# Patient Record
Sex: Female | Born: 1995 | Race: Black or African American | Hispanic: No | Marital: Single | Smoking: Never smoker
Health system: Southern US, Community
[De-identification: ages and names within clinical notes are randomized; demographics above are authoritative.]

## PROBLEM LIST (undated history)

## (undated) DIAGNOSIS — I10 Essential (primary) hypertension: Secondary | ICD-10-CM

## (undated) DIAGNOSIS — T7840XA Allergy, unspecified, initial encounter: Secondary | ICD-10-CM

## (undated) DIAGNOSIS — Z3009 Encounter for other general counseling and advice on contraception: Secondary | ICD-10-CM

## (undated) DIAGNOSIS — L309 Dermatitis, unspecified: Secondary | ICD-10-CM

## (undated) DIAGNOSIS — Z30017 Encounter for initial prescription of implantable subdermal contraceptive: Secondary | ICD-10-CM

## (undated) DIAGNOSIS — E785 Hyperlipidemia, unspecified: Secondary | ICD-10-CM

## (undated) HISTORY — DX: Hyperlipidemia, unspecified: E78.5

## (undated) HISTORY — DX: Encounter for initial prescription of implantable subdermal contraceptive: Z30.017

## (undated) HISTORY — PX: WISDOM TOOTH EXTRACTION: SHX21

## (undated) HISTORY — DX: Allergy, unspecified, initial encounter: T78.40XA

## (undated) HISTORY — DX: Encounter for other general counseling and advice on contraception: Z30.09

## (undated) HISTORY — DX: Dermatitis, unspecified: L30.9

---
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
## 2017-02-14 ENCOUNTER — Other Ambulatory Visit: Payer: Self-pay

## 2017-02-14 ENCOUNTER — Ambulatory Visit (INDEPENDENT_AMBULATORY_CARE_PROVIDER_SITE_OTHER): Payer: Medicaid Other | Admitting: Family Medicine

## 2017-02-14 ENCOUNTER — Encounter: Payer: Self-pay | Admitting: Family Medicine

## 2017-02-14 VITALS — BP 124/80 | HR 69 | Temp 98.4°F | Resp 16 | Ht 66.0 in | Wt 284.5 lb

## 2017-02-14 DIAGNOSIS — R5383 Other fatigue: Secondary | ICD-10-CM

## 2017-02-14 DIAGNOSIS — Z6841 Body Mass Index (BMI) 40.0 and over, adult: Secondary | ICD-10-CM

## 2017-02-14 DIAGNOSIS — D649 Anemia, unspecified: Secondary | ICD-10-CM

## 2017-02-14 DIAGNOSIS — E66813 Obesity, class 3: Secondary | ICD-10-CM

## 2017-02-14 DIAGNOSIS — Z13220 Encounter for screening for lipoid disorders: Secondary | ICD-10-CM | POA: Diagnosis not present

## 2017-02-14 DIAGNOSIS — M25572 Pain in left ankle and joints of left foot: Secondary | ICD-10-CM | POA: Diagnosis not present

## 2017-02-14 NOTE — Progress Notes (Signed)
Patient ID: Brandi Madden, female    DOB: 1995-12-06, 22 y.o.   MRN: [id number]  Chief Complaint  Patient presents with  . Foot Swelling    left    Allergies Other  Subjective:   Brandi Madden is a 22 y.o. female who [facility].  HPI Ms. Brandi Madden is a 22 year old female who is a [employer]man at [employer] studying to be a Engineer, civil (consulting)nurse.  She also works at [employer] part-time.  She comes in today to establish care and to seek evaluation of her left foot and ankle.  She reports that for the past 1-2 months she has had pain in her left ankle region.  She reports she did not have any inciting trauma or injury.  She has had no falls.  She reports that for the past week her left ankle has been swollen and bothering her on a constant basis.  She reports that over the past month the pain has gradually worsened and she is finding it difficult to walk to all of her classes across campus because of the pain.  She reports that the pain is sharp in nature or it can be dull and throbbing.  She reports her current pain is a 7-8 out of 10.  She is not taking any medication for the pain.  She does tell me that last month she went to the emergency department at [facility] for evaluation.  Upon review of the chart, I informed the patient that it was actually approximately 4-5 months ago that she was seen in the emergency department for the ankle pain.  She did have x-rays performed at that time which were negative.  She then remembers that she did injure her ankle quite a while ago and the pain got better and her symptoms resolved.  Th then apparently 1-2 months ago, with no inciting injury or event, her ankle became painful and is subsequently been warm and swollen.  She reports that she is a healthy lady.  She denies any tobacco alcohol or drug use.  She has never been sexually active.  She does have a hormonal implant in her left arm, but she reports that her mother made her  do this for protection in case she did become sexually active.  She reports that she is previously been seen in the pediatric office in town.  She reports that her energy level is pretty good but she can get low energy from time to time.  She reports her mood is good.  She denies any depression, anxiety or struggles with her mood.  She reports her grades are good and she is very focused on her schoolwork.  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  She does not do any form of exercise.  She reports that she has been overweight for a long time.  She has never been screened for diabetes or high cholesterol.  She does have a family history of cardiovascular disease.  [facility] is followed at [facility].  She reports that her periods last approximately 1 week.  She has been anemic in the past but never actually took the iron pills.  She reports that her mother never bought them for her so she never started taking them.   Foot Injury   The incident occurred more than 1 week ago (no injury but pain for a month). There was no injury mechanism. The pain is present in the left ankle. The  pain is at a severity of 7/10. The pain is moderate. The pain has been fluctuating since onset. Pertinent negatives include no loss of motion, loss of sensation, muscle weakness, numbness or tingling.    Past Medical History:  Diagnosis Date  . Allergy   . Eczema   . Encounter for education about contraceptive use 06/29/2015  . Hyperlipemia   . Nexplanon insertion 07/21/2015   Inserted left arm 07/21/15    Past Surgical History:  Procedure Laterality Date  . WISDOM TOOTH EXTRACTION      Family History  Problem Relation Age of Onset  . Hypertension Mother   . Hyperlipidemia Mother   . Stroke Maternal Grandmother   . Hypertension Maternal Grandfather   . Diabetes Paternal Grandmother      Social History   Socioeconomic History  . Marital status: Single    Spouse name: None  . Number of children: None  . Years  of education: None  . Highest education level: None  Social Needs  . Financial resource strain: None  . Food insecurity - worry: None  . Food insecurity - inability: None  . Transportation needs - medical: None  . Transportation needs - non-medical: None  Occupational History  . None  Tobacco Use  . Smoking status: Never Smoker  . Smokeless tobacco: Never Used  Substance and Sexual Activity  . Alcohol use: No  . Drug use: No  . Sexual activity: Not Currently    Birth control/protection: Implant  Other Topics Concern  . None  Social History Narrative   Live in [place], [place].   Has a younger sister, 7 y/o.   Married.    Eats all food groups.   Has a car at school.   No tickets. Wear seatbelt.       [facility].     Review of Systems  Constitutional: Negative for activity change, appetite change and fever.  HENT: Negative for nosebleeds, rhinorrhea, sinus pressure, sinus pain and trouble swallowing.   Eyes: Negative for visual disturbance.  Respiratory: Negative for cough, chest tightness and shortness of breath.   Cardiovascular: Negative for chest pain, palpitations and leg swelling.  Gastrointestinal: Negative for abdominal pain, nausea and vomiting.  Genitourinary: Negative for dysuria, frequency and urgency.  Musculoskeletal: Positive for arthralgias and joint swelling.       Just pain and swelling in the left ankle.   Skin: Negative for rash.  Neurological: Negative for dizziness, tingling, syncope, light-headedness and numbness.  Hematological: Negative for adenopathy. Does not bruise/bleed easily.  Psychiatric/Behavioral: Negative for behavioral problems, dysphoric mood, sleep disturbance and suicidal ideas. The patient is not nervous/anxious.      Objective:   BP 124/80 (BP Location: Left Arm, Patient Position: Sitting, Cuff Size: Normal)   Pulse 69   Temp 98.4 F (36.9 C) (Temporal)   Resp 16   Ht 5\' 6"  (1.676 m)   Wt 284 lb 8 oz (129 kg)   LMP  02/12/2017   SpO2 96%   BMI 45.92 kg/m   Physical Exam  Constitutional: She is oriented to person, place, and time. She appears well-developed and well-nourished. No distress.  HENT:  Head: Normocephalic and atraumatic.  Eyes: Pupils are equal, round, and reactive to light.  Neck: Normal range of motion. Neck supple. No thyromegaly present.  Cardiovascular: Normal rate, regular rhythm and normal heart sounds.  Pulmonary/Chest: Effort normal and breath sounds normal. No respiratory distress.  Musculoskeletal:       Left ankle: She  exhibits decreased range of motion and swelling. She exhibits no ecchymosis, no deformity and no laceration. Tenderness. Lateral malleolus tenderness found. No medial malleolus tenderness found. Achilles tendon exhibits no pain, no defect and normal Thompson's test results.  Patient with decreased range of motion with plantar and dorsiflexion.  Pain with inversion and eversion of foot at the ankle.  1+ edema in ankle, mainly dependent.  Overall foot is slightly warmer than the right foot.  Neurological: She is alert and oriented to person, place, and time. No cranial nerve deficit.  Skin: Skin is warm and dry.  Psychiatric: She has a normal mood and affect. Her behavior is normal. Judgment and thought content normal.  Nursing note and vitals reviewed.    Assessment and Plan  1. Acute left ankle pain Patient currently home from college and has limited time span to get evaluation.  Will likely need orthopedic evaluation due to decreased range of motion, tenderness, and swelling in her ankle.  She is having difficulty with walking to class and daily activities secondary to pain and swelling.  We called and got an appointment with her to be seen in orthopedic in the [place] office today.  She is headed there upon leaving our office. - Ambulatory referral to Orthopedic Surgery  2. Anemia, unspecified type History of anemia with no iron supplementation initiated.  Check  labs today - CBC with Differential/Platelet - Fe+TIBC+Fer  3. Fatigue, unspecified type Episodic, sporadic, functioning well.  Rule out underlying metabolic etiology - TSH - Basic metabolic panel  4. Screening cholesterol level - Lipid panel  5. Class 3 severe obesity due to excess calories without serious comorbidity with body mass index (BMI) of 45.0 to 49.9 in adult [facility]) Long discussion with patient today regarding her weight and the risks that are associated with her body mass index.  We discussed that her weight puts her at increased risk for diabetes, cardiac issues, hypertension and other obesity related health disorders.  She is interested in losing weight and getting in better physical condition.  We will check her baseline labs today.  She will follow-up and will consider nutritional therapy for weight loss assistance. - Hemoglobin A1c - Lipid panel  Records quested from previous PCP/pediatrician Return in about 2 months (around 04/14/2017) for Follow-up CPE. Aliene Beams, MD 02/14/2017

## 2017-02-15 LAB — LIPID PANEL
CHOLESTEROL: 228 mg/dL — AB (ref <170)
HDL: 49 mg/dL (ref >45)
LDL Cholesterol (Calc): 166 mg/dL (calc) — ABNORMAL HIGH (ref <110)
Non-HDL Cholesterol (Calc): 179 mg/dL (calc) — ABNORMAL HIGH (ref <120)
TRIGLYCERIDES: 43 mg/dL (ref <90)
Total CHOL/HDL Ratio: 4.7 (calc) (ref <5.0)

## 2017-02-15 LAB — CBC WITH DIFFERENTIAL/PLATELET
BASOS PCT: 0.5 %
Basophils Absolute: 29 cells/uL (ref 0–200)
EOS PCT: 2.4 %
Eosinophils Absolute: 139 cells/uL (ref 15–500)
HCT: 30.8 % — ABNORMAL LOW (ref 35.0–45.0)
HEMOGLOBIN: 9.8 g/dL — AB (ref 11.7–15.5)
LYMPHS ABS: 2152 {cells}/uL (ref 850–3900)
MCH: 24 pg — ABNORMAL LOW (ref 27.0–33.0)
MCHC: 31.8 g/dL — ABNORMAL LOW (ref 32.0–36.0)
MCV: 75.3 fL — AB (ref 80.0–100.0)
MPV: 9.7 fL (ref 7.5–12.5)
Monocytes Relative: 9 %
NEUTROS ABS: 2958 {cells}/uL (ref 1500–7800)
Neutrophils Relative %: 51 %
Platelets: 412 10*3/uL — ABNORMAL HIGH (ref 140–400)
RBC: 4.09 10*6/uL (ref 3.80–5.10)
RDW: 15.5 % — AB (ref 11.0–15.0)
Total Lymphocyte: 37.1 %
WBC: 5.8 10*3/uL (ref 3.8–10.8)
WBCMIX: 522 {cells}/uL (ref 200–950)

## 2017-02-15 LAB — IRON,TIBC AND FERRITIN PANEL
%SAT: 19 % (calc) (ref 8–45)
FERRITIN: 45 ng/mL (ref 6–67)
IRON: 61 ug/dL (ref 27–164)
TIBC: 321 mcg/dL (calc) (ref 271–448)

## 2017-02-15 LAB — BASIC METABOLIC PANEL
BUN: 8 mg/dL (ref 7–20)
CHLORIDE: 106 mmol/L (ref 98–110)
CO2: 23 mmol/L (ref 20–32)
CREATININE: 0.56 mg/dL (ref 0.50–1.00)
Calcium: 9.1 mg/dL (ref 8.9–10.4)
Glucose, Bld: 87 mg/dL (ref 65–99)
POTASSIUM: 5.1 mmol/L (ref 3.8–5.1)
Sodium: 138 mmol/L (ref 135–146)

## 2017-02-15 LAB — HEMOGLOBIN A1C
HEMOGLOBIN A1C: 5.3 %{Hb} (ref <5.7)
Mean Plasma Glucose: 105 (calc)
eAG (mmol/L): 5.8 (calc)

## 2017-02-15 LAB — TSH: TSH: 2.77 mIU/L

## 2017-02-18 ENCOUNTER — Encounter: Payer: Self-pay | Admitting: Family Medicine

## 2017-02-18 DIAGNOSIS — D649 Anemia, unspecified: Secondary | ICD-10-CM

## 2017-02-19 NOTE — Telephone Encounter (Signed)
Patient informed of message below, verbalized understanding.  

## 2017-02-19 NOTE — Telephone Encounter (Signed)
Please call patient and advise her that her lab results do reveal that she is very anemic.  Her hemoglobin is approximately 9.   Please come to the lab to get the blood work completed and after I review it I will call  iron pills if needed.  Advise her that her hemoglobin A1c was within normal limits and she is not prediabetic nor diabetic at this time.

## 2017-02-24 ENCOUNTER — Encounter: Payer: Self-pay | Admitting: Family Medicine

## 2017-02-24 LAB — HEMOGLOBINOPATHY EVALUATION
Fetal Hemoglobin Testing: <1 % (ref 0.0–1.9)
HCT: 32.4 % — ABNORMAL LOW (ref 35.0–45.0)
HEMOGLOBIN A2 - HGBRFX: 2.2 % (ref 1.8–3.5)
HEMOGLOBIN: 9.7 g/dL — AB (ref 11.7–15.5)
Hgb A: 96.8 % (ref >96.0)
MCH: 24.2 pg — AB (ref 27.0–33.0)
MCV: 80.8 fL (ref 80.0–100.0)
RBC: 4.01 10*6/uL (ref 3.80–5.10)
RDW: 15.4 % — ABNORMAL HIGH (ref 11.0–15.0)

## 2017-02-24 LAB — RETICULOCYTES
ABS RETIC: 59850 {cells}/uL (ref 20000–8000)
Retic Ct Pct: 1.5 %

## 2017-02-26 ENCOUNTER — Telehealth: Payer: Self-pay | Admitting: Family Medicine

## 2017-02-26 DIAGNOSIS — D649 Anemia, unspecified: Secondary | ICD-10-CM

## 2017-02-26 NOTE — Telephone Encounter (Signed)
Please call patient and advised that her hemoglobin is approximately 9.7.  This is low and she is anemic.  However, her iron stores in her body are within normal limits and I do not believe that this is necessarily the cause of her anemia at this time.  Her hemoglobin type is normal and it does not show that she has an abnormal hemoglobin meaning she does not have thalassemia or sickle cell.  At this time, I would like for her to follow-up with hematology for evaluation of her anemia.  I have placed a referral.  Please advise that she should receive a call regarding scheduling an appointment.

## 2017-02-27 ENCOUNTER — Telehealth: Payer: Self-pay

## 2017-02-27 NOTE — Telephone Encounter (Signed)
Please call pt back about her blood work.  She has additional questions and wants to talk with you

## 2017-02-27 NOTE — Telephone Encounter (Signed)
Patient called back and wanted me to explain to her mother the labs. I did so.

## 2017-02-27 NOTE — Telephone Encounter (Signed)
Patient informed of message below, verbalized understanding.  

## 2017-03-26 ENCOUNTER — Encounter: Payer: Medicaid Other | Admitting: Family Medicine

## 2017-03-28 ENCOUNTER — Ambulatory Visit (INDEPENDENT_AMBULATORY_CARE_PROVIDER_SITE_OTHER): Payer: Medicaid Other | Admitting: Family Medicine

## 2017-03-28 ENCOUNTER — Ambulatory Visit (HOSPITAL_COMMUNITY): Payer: Medicaid Other | Admitting: Hematology

## 2017-03-28 ENCOUNTER — Encounter: Payer: Self-pay | Admitting: Family Medicine

## 2017-03-28 ENCOUNTER — Other Ambulatory Visit: Payer: Self-pay

## 2017-03-28 VITALS — BP 126/84 | HR 78 | Temp 98.2°F | Resp 16 | Ht 66.0 in | Wt 283.2 lb

## 2017-03-28 DIAGNOSIS — H66003 Acute suppurative otitis media without spontaneous rupture of ear drum, bilateral: Secondary | ICD-10-CM

## 2017-03-28 DIAGNOSIS — Z6841 Body Mass Index (BMI) 40.0 and over, adult: Secondary | ICD-10-CM

## 2017-03-28 DIAGNOSIS — D649 Anemia, unspecified: Secondary | ICD-10-CM | POA: Diagnosis not present

## 2017-03-28 DIAGNOSIS — H6122 Impacted cerumen, left ear: Secondary | ICD-10-CM

## 2017-03-28 LAB — POCT INFLUENZA A/B
INFLUENZA B, POC: NEGATIVE
Influenza A, POC: NEGATIVE

## 2017-03-28 LAB — POCT RAPID STREP A (OFFICE): Rapid Strep A Screen: NEGATIVE

## 2017-03-28 MED ORDER — AZITHROMYCIN 250 MG PO TABS: ORAL_TABLET | ORAL | 0 refills | Status: DC

## 2017-03-28 NOTE — Progress Notes (Signed)
Patient ID: Brandi Madden, female    DOB: 24-Jan-1995, 22 y.o.   MRN: [id number]  Chief Complaint  Patient presents with  . Generalized Body Aches  . Sore Throat  . Emesis  . Ear Pain    Allergies Other  Subjective:   [name] is a 22 y.o. female who presents to [facility] today.  HPI Brandi Madden presents today with her mother for a visit. She was initially scheduled for a cPE but is sick. Reports that for over a week has had a sore throat,  cough, burning in chest with cough. Initially she felt she was getting better after about 3 days but then got worse. Reports that has gotten worse. [facility] ED b/c of symtpoms. has a CXR done which was normal. Has had persistent left ear pain over the past several days which has progressively been getting worse. No dysuria. Drinking less. Coughing on and off all night. Has had a decreased appetite and did not eat today. Drank some orange juice. Reports that she has had  Subjective fever, chills. Has had no diarrhea. No BM in a couple day but not eating much. No abdominal pain. Not been sleeping well b/c sick. Using some OTC cough syrup.   Her mother is her for the visit and would like to discuss her labs. She had not been on iron pills. She had been anemic for years. Reports fatigue with anemia.  Hyperlipidemia  This is a new problem. This is a new diagnosis. The problem is uncontrolled. Recent lipid tests were reviewed and are high. Exacerbating diseases include obesity. She has no history of chronic renal disease, diabetes, hypothyroidism, liver disease or nephrotic syndrome. Factors aggravating her hyperlipidemia include fatty foods. Pertinent negatives include no chest pain, focal sensory loss, focal weakness or shortness of breath. She is currently on no antihyperlipidemic treatment. Compliance problems include adherence to diet and adherence to exercise.  Risk factors for coronary artery disease include family  history, obesity and a sedentary lifestyle.    Past Medical History:  Diagnosis Date  . Allergy   . Eczema   . Encounter for education about contraceptive use 06/29/2015  . Hyperlipemia   . Nexplanon insertion 07/21/2015   Inserted left arm 07/21/15    Past Surgical History:  Procedure Laterality Date  . WISDOM TOOTH EXTRACTION      Family History  Problem Relation Age of Onset  . Hypertension Mother   . Hyperlipidemia Mother   . Stroke Maternal Grandmother   . Hypertension Maternal Grandfather   . Diabetes Paternal Grandmother      Social History   Socioeconomic History  . Marital status: Single    Spouse name: Not on file  . Number of children: Not on file  . Years of education: Not on file  . Highest education level: Not on file  Occupational History  . Not on file  Social Needs  . Financial resource strain: Not on file  . Food insecurity:    Worry: Not on file    Inability: Not on file  . Transportation needs:    Medical: Not on file    Non-medical: Not on file  Tobacco Use  . Smoking status: Never Smoker  . Smokeless tobacco: Never Used  Substance and Sexual Activity  . Alcohol use: No  . Drug use: No  . Sexual activity: Not Currently    Birth control/protection: Implant  Lifestyle  . Physical activity:  Days per week: Not on file    Minutes per session: Not on file  . Stress: Not on file  Relationships  . Social connections:    Talks on phone: Not on file    Gets together: Not on file    Attends religious service: Not on file    Active member of club or organization: Not on file    Attends meetings of clubs or organizations: Not on file    Relationship status: Not on file  Other Topics Concern  . Not on file  Social History Narrative   Live in [place], [place].   Has a younger sister, 105 y/o.   Married.    Eats all food groups.   Has a car at school.   No tickets. Wear seatbelt.       [facility].     Review of Systems    Constitutional: Positive for activity change, appetite change, chills, fatigue and fever.  HENT: Positive for congestion, ear pain, postnasal drip, sinus pressure, sinus pain and sore throat. Negative for mouth sores, nosebleeds, trouble swallowing and voice change.   Eyes: Negative for visual disturbance.  Respiratory: Negative for cough, chest tightness and shortness of breath.   Cardiovascular: Negative for chest pain, palpitations and leg swelling.  Gastrointestinal: Negative for abdominal pain, diarrhea, nausea and vomiting.  Genitourinary: Negative for decreased urine volume, difficulty urinating, dysuria, frequency and urgency.  Musculoskeletal: Positive for arthralgias.  Neurological: Negative for dizziness, tremors, focal weakness, syncope, weakness and light-headedness.  Hematological: Negative for adenopathy.  Psychiatric/Behavioral: Negative for agitation and dysphoric mood. The patient is not nervous/anxious.      Objective:   BP 126/84 (BP Location: Left Arm, Patient Position: Sitting, Cuff Size: Normal)   Pulse 78   Temp 98.2 F (36.8 C) (Temporal)   Resp 16   Ht 5\' 6"  (1.676 m)   Wt 283 lb 4 oz (128.5 kg)   SpO2 99%   BMI 45.72 kg/m   Physical Exam  Constitutional: She is oriented to person, place, and time. She appears well-developed and well-nourished.  Non-toxic appearance. No distress.  HENT:  Head: Normocephalic and atraumatic.  Right Ear: Hearing and ear canal normal. There is tenderness. No swelling. No mastoid tenderness. Tympanic membrane is erythematous. Tympanic membrane is not perforated. A middle ear effusion is present.  Left Ear: Hearing and ear canal normal. There is tenderness. No swelling. No mastoid tenderness. Tympanic membrane is erythematous and bulging. Tympanic membrane is not perforated. A middle ear effusion is present.  Nose: Rhinorrhea present. No mucosal edema. No epistaxis. Right sinus exhibits maxillary sinus tenderness and frontal  sinus tenderness. Left sinus exhibits maxillary sinus tenderness and frontal sinus tenderness.  Mouth/Throat: Uvula is midline and mucous membranes are normal. Mucous membranes are not pale and not dry. Oropharyngeal exudate and posterior oropharyngeal erythema present. Tonsils are 2+ on the right. Tonsils are 2+ on the left. No tonsillar exudate.  Left ear canal obstructed with cerumen. Ear canal curetted and irrigated. Cerumen removed. TM red and bulging on left. No perforation. Hearing normal.   Eyes: Pupils are equal, round, and reactive to light. EOM are normal.  Neck: Normal range of motion. Neck supple. No thyromegaly present.  Tender submandibular glands.   Cardiovascular: Normal rate, regular rhythm and normal heart sounds.  Pulmonary/Chest: Effort normal. No stridor. No respiratory distress. She has no wheezes. She has no rhonchi. She exhibits no tenderness.  Abdominal: Soft. Bowel sounds are normal. She exhibits no distension.  There is no tenderness.  Neurological: She is alert and oriented to person, place, and time.  Skin: Skin is warm and dry. Capillary refill takes less than 2 seconds. No erythema.  Psychiatric: She has a normal mood and affect. Her behavior is normal.     Assessment and Plan  1. Acute suppurative otitis media of both ears without spontaneous rupture of tympanic membranes, recurrence not specified  -Supportive care encouraged and discussed.  -Tylenol OTC -Delsym OTC prn.  Patient counseled in detail regarding the risks of medication. Told to call or return to clinic if develop any worrisome signs or symptoms. Patient voiced understanding.  Counseled regarding worrisome s/s and if develop to the ED. ED records reviewed from [facility].  - azithromycin (ZITHROMAX) 250 MG tablet; Take two pills today and one pill a day for the next four days.  Dispense: 6 tablet; Refill: 0 - POCT Influenza A/B - POCT rapid strep A - Ear Lavage  2. Anemia, unspecified type Discussion  with mother regarding referral to [facility]. Discussed that patient with anemia and normal Hgb phenotype but not indicative of iron deficiency. Uncertain etiology regarding anemia. Referral placed for evaluation and recommendation on management/workup.   3. Cerumen impaction Ear care discussed and cerumen removed.   4.  Obesity The patient is asked to make an attempt to improve diet and exercise patterns to aid in medical management of this problem.  5. HLD, high LDL, Low HDL Plan to follow up and recheck in 3 months after dietary interventions. Diet discussed and encouraged to eat less fast food and more high fiber/low sugar/low fat and cholesterol foods. Discussed risk of HLD and increased risk of AMI/CVA.  Return in about 1 month (around 04/25/2017). Aliene Beams, MD 03/28/2017

## 2017-04-03 ENCOUNTER — Encounter (HOSPITAL_COMMUNITY): Payer: Self-pay | Admitting: Internal Medicine

## 2017-04-03 ENCOUNTER — Inpatient Hospital Stay (HOSPITAL_COMMUNITY): Payer: Medicaid Other

## 2017-04-03 ENCOUNTER — Inpatient Hospital Stay (HOSPITAL_COMMUNITY): Payer: Medicaid Other | Attending: Hematology | Admitting: Internal Medicine

## 2017-04-03 VITALS — BP 136/86 | HR 54 | Temp 97.4°F | Resp 18 | Ht 65.5 in | Wt 284.2 lb

## 2017-04-03 DIAGNOSIS — D509 Iron deficiency anemia, unspecified: Secondary | ICD-10-CM

## 2017-04-03 DIAGNOSIS — N920 Excessive and frequent menstruation with regular cycle: Secondary | ICD-10-CM | POA: Diagnosis not present

## 2017-04-03 DIAGNOSIS — J069 Acute upper respiratory infection, unspecified: Secondary | ICD-10-CM | POA: Diagnosis not present

## 2017-04-03 DIAGNOSIS — Z79899 Other long term (current) drug therapy: Secondary | ICD-10-CM

## 2017-04-03 DIAGNOSIS — D6489 Other specified anemias: Secondary | ICD-10-CM

## 2017-04-03 DIAGNOSIS — E785 Hyperlipidemia, unspecified: Secondary | ICD-10-CM | POA: Diagnosis not present

## 2017-04-03 LAB — COMPREHENSIVE METABOLIC PANEL
ALK PHOS: 72 U/L (ref 38–126)
ALT: 12 U/L — ABNORMAL LOW (ref 14–54)
AST: 13 U/L — AB (ref 15–41)
Albumin: 3.4 g/dL — ABNORMAL LOW (ref 3.5–5.0)
Anion gap: 7 (ref 5–15)
BILIRUBIN TOTAL: 0.4 mg/dL (ref 0.3–1.2)
BUN: 12 mg/dL (ref 6–20)
CALCIUM: 8.8 mg/dL — AB (ref 8.9–10.3)
CO2: 23 mmol/L (ref 22–32)
Chloride: 105 mmol/L (ref 101–111)
Creatinine, Ser: 0.63 mg/dL (ref 0.44–1.00)
GFR calc Af Amer: >60 mL/min (ref >60)
Glucose, Bld: 86 mg/dL (ref 65–99)
POTASSIUM: 3.9 mmol/L (ref 3.5–5.1)
Sodium: 135 mmol/L (ref 135–145)
TOTAL PROTEIN: 7.7 g/dL (ref 6.5–8.1)

## 2017-04-03 LAB — CBC WITH DIFFERENTIAL/PLATELET
Basophils Absolute: 0 10*3/uL (ref 0.0–0.1)
Basophils Relative: 0 %
Eosinophils Absolute: 0.2 10*3/uL (ref 0.0–0.7)
Eosinophils Relative: 4 %
HEMATOCRIT: 32.2 % — AB (ref 36.0–46.0)
HEMOGLOBIN: 10 g/dL — AB (ref 12.0–15.0)
LYMPHS PCT: 40 %
Lymphs Abs: 2 10*3/uL (ref 0.7–4.0)
MCH: 24 pg — ABNORMAL LOW (ref 26.0–34.0)
MCHC: 31.1 g/dL (ref 30.0–36.0)
MCV: 77.4 fL — AB (ref 78.0–100.0)
MONO ABS: 0.5 10*3/uL (ref 0.1–1.0)
MONOS PCT: 10 %
NEUTROS ABS: 2.3 10*3/uL (ref 1.7–7.7)
Neutrophils Relative %: 46 %
Platelets: 474 10*3/uL — ABNORMAL HIGH (ref 150–400)
RBC: 4.16 MIL/uL (ref 3.87–5.11)
RDW: 16.1 % — AB (ref 11.5–15.5)
WBC: 4.9 10*3/uL (ref 4.0–10.5)

## 2017-04-03 LAB — VITAMIN B12: Vitamin B-12: 244 pg/mL (ref 180–914)

## 2017-04-03 LAB — LACTATE DEHYDROGENASE: LDH: 98 U/L (ref 98–192)

## 2017-04-03 LAB — FERRITIN: Ferritin: 29 ng/mL (ref 11–307)

## 2017-04-03 LAB — FOLATE: FOLATE: 5.1 ng/mL — AB (ref >5.9)

## 2017-04-03 NOTE — Progress Notes (Signed)
Referring physician:  Dr. Aliene Beamsachel Hagler  Diagnosis Anemia due to other cause, not classified - Plan: CBC with Differential/Platelet, Comprehensive metabolic panel, Lactate dehydrogenase, Protein electrophoresis, serum, Ferritin, Vitamin B12, Folate, Hemoglobinopathy evaluation  Staging Cancer Staging No matching staging information was found for the patient.  Assessment and plan:  1.   microcytic anemia.  Labs will be performed today with ferritin B12 folate SPEP hemoglobin electrophoresis.  If findings are consistent with iron deficiency I have discussed with her options of oral iron versus intravenous iron.  She will be contacted if iron levels are low.   She will be given a follow-up in 2 weeks to go over the remaining of her lab studies.  2.  Menorrhagia.  She should follow-up with GYN if ongoing heavy cycles.  3.  Family history of sickle cell trait.  Hemoglobin electrophoresis performed today with lab evaluation.  4.  URI.  She was recently treated with Zpac and Tamiflu.     HPI: 22 year old female referred for evaluation of microcytic anemia.  She reports heavy cycles.  Denies any craving of ice.  Her father's side had sickle cell.  Recent labs done 02/14/2017 showed a white count of 5.8 hemoglobin 9.8 platelets were 412,000.  Ferritin at that time was 45.  Chemistries within normal limits with a creatinine of 0.56.  She denies any history of colon cancer.  She has not undergone any gastric bypass surgeries.  Patient is seen today for consultation due to microcytic anemia.    Past Medical History Past Medical History:  Diagnosis Date  . Allergy   . Eczema   . Encounter for education about contraceptive use 06/29/2015  . Hyperlipemia   . Nexplanon insertion 07/21/2015   Inserted left arm 07/21/15    Past Surgical History Past Surgical History:  Procedure Laterality Date  . WISDOM TOOTH EXTRACTION      Family History Family History  Problem Relation Age of Onset  .  Hypertension Mother   . Hyperlipidemia Mother   . Stroke Maternal Grandmother   . Hypertension Maternal Grandfather   . Diabetes Paternal Grandmother      Social History  reports that she has never smoked. She has never used smokeless tobacco. She reports that she does not drink alcohol or use drugs.  Medications Prior to Admission medications   Medication Sig Start Date End Date Taking? Authorizing Provider  oseltamivir (TAMIFLU) 75 MG capsule Take by mouth. 02/25/17  Yes [provider]  atorvastatin (LIPITOR) 10 MG tablet TK 1 T PO QD 09/15/15   [provider]  azithromycin (ZITHROMAX) 250 MG tablet Take two pills today and one pill a day for the next four days. 03/28/17   Aliene BeamsHagler, Rachel, MD  cetirizine (ZYRTEC) 10 MG tablet TK 1 T PO PRN 04/26/15   [provider]  EPINEPHrine 0.3 mg/0.3 mL IJ SOAJ injection INJECT 1 SYRINGE UTD PRF SYSTEMIC REACTION 09/15/15   [provider]  etonogestrel (NEXPLANON) 68 MG IMPL implant 1 each by Subdermal route once.    [provider]  ibuprofen (ADVIL,MOTRIN) 800 MG tablet Take 1 tablet (800 mg total) by mouth 3 (three) times daily. 10/04/16   Burgess AmorIdol, Julie, PA-C  loratadine-pseudoephedrine (CLARITIN-D 12 HOUR) 5-120 MG per tablet Take 1 tablet by mouth 2 (two) times daily. Patient not taking: Reported on 03/28/2017 06/05/13   Ivery QualeBryant, Hobson, PA-C  megestrol (MEGACE) 40 MG tablet take 3 tabs x 5 days then 2 x 5 days then 1 daily 10/13/15  Cyril Mourning A, NP  promethazine-dextromethorphan (PROMETHAZINE-DM) 6.25-15 MG/5ML syrup TAKE 5 ML BY MOUTH AT BEDTIME AS NEEDED FOR COUGH 02/25/17   [provider]  SF 1.1 % GEL dental gel APPLY TO AFFECTED AREA UTD TWICE DAILY PRN. WAIT 30 MINUTES AFTER APPLICATION TO EAT OR DRINK 06/15/15   [provider]    Allergies Other  Review of Systems Review of Systems - Oncology ROS as per HPI otherwise 12 point ROS is negative.   Physical  Exam  Vitals Wt Readings from Last 3 Encounters:  04/03/17 284 lb 3.2 oz (128.9 kg) (>99 %, Z= 2.73)*  03/28/17 283 lb 4 oz (128.5 kg) (>99 %, Z= 2.72)*  02/14/17 284 lb 8 oz (129 kg) (>99 %, Z= 2.72)*   * Growth percentiles are based on CDC (Girls, 2-20 Years) data.   Temp Readings from Last 3 Encounters:  04/03/17 (!) 97.4 F (36.3 C) (Oral)  03/28/17 98.2 F (36.8 C) (Temporal)  02/14/17 98.4 F (36.9 C) (Temporal)   BP Readings from Last 3 Encounters:  04/03/17 136/86  03/28/17 126/84  02/14/17 124/80   Pulse Readings from Last 3 Encounters:  04/03/17 (!) 54  03/28/17 78  02/14/17 69   Constitutional: Well-developed, well-nourished, and in no distress.   HENT: Head: Normocephalic and atraumatic.  Mouth/Throat: No oropharyngeal exudate. Mucosa moist. Eyes: Pupils are equal, round, and reactive to light. Conjunctivae are normal. No scleral icterus.  Neck: Normal range of motion. Neck supple. No JVD present.  Cardiovascular: Normal rate, regular rhythm and normal heart sounds.  Exam reveals no gallop and no friction rub.   No murmur heard. Pulmonary/Chest: Effort normal and breath sounds normal. No respiratory distress. No wheezes.No rales.  Abdominal: Soft. Bowel sounds are normal. No distension. There is no tenderness. There is no guarding.  Musculoskeletal: No edema or tenderness.  Lymphadenopathy: No cervical, axillary or supraclavicular adenopathy.  Neurological: Alert and oriented to person, place, and time. No cranial nerve deficit.  Skin: Skin is warm and dry. No rash noted. No erythema. No pallor.  Psychiatric: Affect and judgment normal.   Labs No visits with results within 3 Day(s) from this visit.  Latest known visit with results is:  Office Visit on 03/28/2017  Component Date Value Ref Range Status  . Influenza A, POC 03/28/2017 Negative  Negative Final  . Influenza B, POC 03/28/2017 Negative  Negative Final  . Rapid Strep A Screen 03/28/2017 Negative   Negative Final     Pathology Orders Placed This Encounter  Procedures  . CBC with Differential/Platelet    Standing Status:   Future    Standing Expiration Date:   04/04/2018  . Comprehensive metabolic panel    Standing Status:   Future    Standing Expiration Date:   04/04/2018  . Lactate dehydrogenase    Standing Status:   Future    Standing Expiration Date:   04/04/2018  . Protein electrophoresis, serum    Standing Status:   Future    Standing Expiration Date:   04/04/2018  . Ferritin    Standing Status:   Future    Standing Expiration Date:   04/04/2018  . Vitamin B12    Standing Status:   Future    Standing Expiration Date:   04/03/2018  . Folate    Standing Status:   Future    Standing Expiration Date:   04/03/2018  . Hemoglobinopathy evaluation    Standing Status:   Future    Standing Expiration Date:  04/03/2018         Ahmed Prima MD   Cc:  Dr. Tracie Harrier

## 2017-04-03 NOTE — Patient Instructions (Addendum)
[facility] Discharge Instructions   You were seen today by Dr. Ahmed PrimaVetta Higgs. She discussed how you've been feeling, if you've had any cravings and if you have any family history of sickle cell. We will get blood work today to further evaluate your iron levels. We will call you if your levels are low and let you know what to do next.  She discussed Iron therapy, oral and IV therapies. We will see you back in 2 weeks for follow up.   Thank you for choosing  [facility] at [facility] to provide your oncology and hematology care.  To afford each patient quality time with our provider, please arrive at least 15 minutes before your scheduled appointment time.    If you have a lab appointment with the Cancer Center please come in thru the  Main Entrance and check in at the main information desk  You need to re-schedule your appointment should you arrive 10 or more minutes late.  We strive to give you quality time with our providers, and arriving late affects you and other patients whose appointments are after yours.  Also, if you no show three or more times for appointments you may be dismissed from the clinic at the providers discretion.     Again, thank you for choosing [facility].  Our hope is that these requests will decrease the amount of time that you wait before being seen by our physicians.       _____________________________________________________________  Should you have questions after your visit to [facility], please contact our office at [phone] between the hours of 8:30 a.m. and 4:30 p.m.  Voicemails left after 4:30 p.m. will not be returned until the following business day.  For prescription refill requests, have your pharmacy contact our office.       Resources For Cancer Patients and their Caregivers ? American Cancer Society: Can assist with transportation, wigs, general needs, runs  Look Good Feel Better.        [phone] ? Cancer Care: Provides financial assistance, online support groups, medication/co-pay assistance.  1-800-813-HOPE (918)301-8585(4673) ? [facility] Assists [place] cancer patients and their families through emotional , educational and financial support.  ([phone] 074-57222348201359 ? [place] DSS Where to apply for food stamps, Medicaid and utility assistance. 302-633-0142971-798-7061 ? [facility]: Transportation to medical appointments. 380-567-4312912-365-1193 ? Social Security Administration: May apply for disability if have a Stage IV cancer. ([phone] 757-4771630 [phone] (941)744-55591-(647) 265-7435 ? [place] [facility], [facility] and Transit Services: Assists with nutrition, care and transit needs. 7757050093(289) 140-6493  Cancer Center Support Programs:   > Cancer Support Group  2nd Tuesday of the month 1pm-2pm, Journey Room   > Creative Journey  3rd Tuesday of the month 1130am-1pm, Journey Room

## 2017-04-04 ENCOUNTER — Encounter: Payer: Self-pay | Admitting: Family Medicine

## 2017-04-04 ENCOUNTER — Other Ambulatory Visit: Payer: Self-pay

## 2017-04-04 ENCOUNTER — Telehealth (HOSPITAL_COMMUNITY): Payer: Self-pay | Admitting: *Deleted

## 2017-04-04 ENCOUNTER — Ambulatory Visit (INDEPENDENT_AMBULATORY_CARE_PROVIDER_SITE_OTHER): Payer: Medicaid Other | Admitting: Family Medicine

## 2017-04-04 DIAGNOSIS — R05 Cough: Secondary | ICD-10-CM

## 2017-04-04 DIAGNOSIS — M25562 Pain in left knee: Secondary | ICD-10-CM

## 2017-04-04 DIAGNOSIS — R059 Cough, unspecified: Secondary | ICD-10-CM

## 2017-04-04 LAB — PROTEIN ELECTROPHORESIS, SERUM
A/G RATIO SPE: 0.9 (ref 0.7–1.7)
ALBUMIN ELP: 3.5 g/dL (ref 2.9–4.4)
ALPHA-2-GLOBULIN: 0.9 g/dL (ref 0.4–1.0)
Alpha-1-Globulin: 0.2 g/dL (ref 0.0–0.4)
Beta Globulin: 1.2 g/dL (ref 0.7–1.3)
Gamma Globulin: 1.4 g/dL (ref 0.4–1.8)
Globulin, Total: 3.7 g/dL (ref 2.2–3.9)
Total Protein ELP: 7.2 g/dL (ref 6.0–8.5)

## 2017-04-04 LAB — HEMOGLOBINOPATHY EVALUATION
HGB C: 0 %
HGB S QUANTITAION: 0 %
Hgb A2 Quant: 2.2 % (ref 1.8–3.2)
Hgb A: 97.8 % (ref 96.4–98.8)
Hgb F Quant: 0 % (ref 0.0–2.0)
Hgb Variant: 0 %

## 2017-04-04 NOTE — Progress Notes (Signed)
Patient ID: ADNA Brandi Madden, female    DOB: [birth year], 22 y.o.   MRN: [id number]  Chief Complaint  Patient presents with  . Follow-up    Allergies Peanut-containing drug products and Other  Subjective:   Brandi Madden is a 22 y.o. female who presents to [facility] today.  HPI Patient presents today.  She reports that she has been feeling well.  Has gone over respiratory infection.  No cough, shortness of breath, ear pain, or upper respiratory symptoms.  Denies any subsequent sore throat, sinus pressure, or ear pain.  She reports that her knee pain is resolved.  Was seen by orthopedic and does not have any pain.  Has used a brace that he is given her.  Would like for [place] to review her immunization records because today's the last day that she will have regular insurance and can give vaccinations.  She does not have the report with her but would like for [place] to try to get her records from her previous pediatrician.  She has been working on her diet and exercise.  She is trying to lose some weight.  She reports she has been eating a healthier diet.  She is going to follow back up for her physical examination.  [facility]   Past Medical History:  Diagnosis Date  . Allergy   . Eczema   . Encounter for education about contraceptive use 06/29/2015  . Hyperlipemia   . Nexplanon insertion 07/21/2015   Inserted left arm 07/21/15    Past Surgical History:  Procedure Laterality Date  . WISDOM TOOTH EXTRACTION      Family History  Problem Relation Age of Onset  . Hypertension Mother   . Hyperlipidemia Mother   . Stroke Maternal Grandmother   . Hypertension Maternal Grandfather   . Diabetes Paternal Grandmother      Social History   Socioeconomic History  . Marital status: Single    Spouse name: Not on file  . Number of children: Not on file  . Years of education: Not on file  . Highest education level: Not on  file  Occupational History  . Not on file  Social Needs  . Financial resource strain: Not on file  . Food insecurity:    Worry: Not on file    Inability: Not on file  . Transportation needs:    Medical: Not on file    Non-medical: Not on file  Tobacco Use  . Smoking status: Never Smoker  . Smokeless tobacco: Never Used  Substance and Sexual Activity  . Alcohol use: No  . Drug use: No  . Sexual activity: Not Currently    Birth control/protection: Implant  Lifestyle  . Physical activity:    Days per week: Not on file    Minutes per session: Not on file  . Stress: Not on file  Relationships  . Social connections:    Talks on phone: Not on file    Gets together: Not on file    Attends religious service: Not on file    Active member of club or organization: Not on file    Attends meetings of clubs or organizations: Not on file    Relationship status: Not on file  Other Topics Concern  . Not on file  Social History Narrative   Live in [place], [place].   Has a younger sister, 14 y/o.   Married.    Eats all  food groups.   Has a car at school.   No tickets. Wear seatbelt.       [facility].     Review of Systems  Constitutional: Negative for activity change, appetite change and fever.  HENT: Negative for congestion, ear discharge, ear pain, nosebleeds, postnasal drip, sinus pressure and sneezing.   Eyes: Negative for visual disturbance.  Respiratory: Negative for cough, chest tightness and shortness of breath.   Cardiovascular: Negative for chest pain, palpitations and leg swelling.  Gastrointestinal: Negative for abdominal pain, nausea and vomiting.  Genitourinary: Negative for dysuria, frequency and urgency.  Musculoskeletal: Negative for arthralgias, back pain and myalgias.  Skin: Negative for rash.  Neurological: Negative for dizziness, syncope and light-headedness.  Hematological: Negative for adenopathy.     Objective:   BP 124/82 (BP Location: Left  Arm, Patient Position: Sitting, Cuff Size: Normal)   Pulse 60   Temp 98.3 F (36.8 C) (Temporal)   Resp 16   Ht 5\' 6"  (1.676 m)   Wt 283 lb (128.4 kg)   SpO2 99%   BMI 45.68 kg/m   Physical Exam  Constitutional: She is oriented to person, place, and time. She appears well-developed and well-nourished. No distress.  HENT:  Head: Normocephalic and atraumatic.  Eyes: Pupils are equal, round, and reactive to light.  Neck: Normal range of motion. Neck supple. No thyromegaly present.  Cardiovascular: Normal rate, regular rhythm and normal heart sounds.  Pulmonary/Chest: Effort normal and breath sounds normal. No respiratory distress.  Neurological: She is alert and oriented to person, place, and time. No cranial nerve deficit.  Skin: Skin is warm and dry.  Psychiatric: She has a normal mood and affect. Her behavior is normal. Judgment and thought content normal.  Nursing note and vitals reviewed.    Assessment and Plan  1. Cough Resolved. URI symptoms resolved.   2. Left knee pain, unspecified chronicity Resolved.   3. Morbid obesity The patient is asked to make an attempt to improve diet and exercise patterns to aid in medical management of this problem.  Lifestyle modifications discussed with patient including a diet emphasizing vegetables, fruits, and whole grains. Limiting intake of sodium to less than 2,400 mg per day.  Recommendations discussed include consuming low-fat dairy products, poultry, fish, legumes, non-tropical vegetable oils, and nuts; and limiting intake of sweets, sugar-sweetened beverages, and red meat Follow up with OB/GYN.  Called previous PCP while in OV today and had copy of immunizations sent to office and reviewed. Immunizations are UTD. OV was 15 minutes. Greater than 50% spend counseling patient on above.   No follow-ups on file. Brandi Beamsachel Zykira Matlack, MD 04/04/2017

## 2017-04-04 NOTE — Telephone Encounter (Signed)
I spoke with the pt and her mother about CBC, CMP and iron studies. Per Dr. Melton AlarHiggs the pt should start on an OTC iron supplement. I advised the pt and her mother that she would need to pick Ferrous Sulfate 325mg  and take this daily. I also informed them that Dr. Melton AlarHiggs referred the pt to a Gynecologist to have her cycles evaluated.  They both verbalized understanding.

## 2017-04-07 ENCOUNTER — Ambulatory Visit (HOSPITAL_COMMUNITY): Payer: Medicaid Other | Admitting: Hematology

## 2017-04-18 ENCOUNTER — Ambulatory Visit (HOSPITAL_COMMUNITY): Payer: Medicaid Other | Admitting: Hematology

## 2017-04-18 ENCOUNTER — Inpatient Hospital Stay (HOSPITAL_COMMUNITY): Payer: Medicaid Other | Attending: Hematology | Admitting: Internal Medicine

## 2017-04-18 ENCOUNTER — Ambulatory Visit: Payer: Medicaid Other | Admitting: Adult Health

## 2017-04-18 ENCOUNTER — Other Ambulatory Visit: Payer: Self-pay

## 2017-04-18 ENCOUNTER — Encounter (HOSPITAL_COMMUNITY): Payer: Self-pay | Admitting: Internal Medicine

## 2017-04-18 VITALS — BP 133/79 | HR 63 | Resp 16 | Ht 66.0 in | Wt 279.0 lb

## 2017-04-18 DIAGNOSIS — E785 Hyperlipidemia, unspecified: Secondary | ICD-10-CM | POA: Insufficient documentation

## 2017-04-18 DIAGNOSIS — N920 Excessive and frequent menstruation with regular cycle: Secondary | ICD-10-CM | POA: Insufficient documentation

## 2017-04-18 DIAGNOSIS — Z79899 Other long term (current) drug therapy: Secondary | ICD-10-CM | POA: Insufficient documentation

## 2017-04-18 DIAGNOSIS — D500 Iron deficiency anemia secondary to blood loss (chronic): Secondary | ICD-10-CM | POA: Insufficient documentation

## 2017-04-18 NOTE — Progress Notes (Signed)
Diagnosis Iron deficiency anemia due to chronic blood loss - Plan: CBC with Differential/Platelet, Comprehensive metabolic panel, Lactate dehydrogenase, Ferritin  Staging Cancer Staging No matching staging information was found for the patient.  Assessment and Plan:  1.   microcytic anemia.  Labs done 04/03/2017 showed a white count 4.16 hemoglobin 10 platelets 174,000.  Ferritin was 29.  B12 normal.  SPEP negative, hemoglobin electrophoresis normal.  Due to the low normal ferritin she was given option of IV versus oral iron.  Patient desired to take oral iron.  She should continue iron as directed.  She will return to clinic in 3 months for follow-up.   2.  Menorrhagia.  She reports she will see GYN today.  3.  Family history of sickle cell trait.  Hemoglobin electrophoresis done 04/03/2017 was normal.  Interval history: 22 year old female referred for evaluation of microcytic anemia.  She reports heavy cycles.  Denies any craving of ice.  Her father's side had sickle cell.  labs done 02/14/2017 showed a white count of 5.8 hemoglobin 9.8 platelets were 412,000.  Ferritin at that time was 45.  Chemistries within normal limits with a creatinine of 0.56.  She denies any history of colon cancer.  She has not undergone any gastric bypass surgeries.  Patient is seen today for consultation due to microcytic anemia.  Current status: Patient is seen today for follow-up to go over labs.  She reports she is scheduled to see GYN today.  She is tolerating oral iron without problems.  Problem List Patient Active Problem List   Diagnosis Date Noted  . Irregular intermenstrual bleeding [N92.1] 10/13/2015  . Nexplanon in place [Z97.5] 10/13/2015  . Nexplanon insertion [E08.144] 07/21/2015  . Encounter for education about contraceptive use [Z30.09] 06/29/2015    Past Medical History Past Medical History:  Diagnosis Date  . Allergy   . Eczema   . Encounter for education about contraceptive use 06/29/2015   . Hyperlipemia   . Nexplanon insertion 07/21/2015   Inserted left arm 07/21/15    Past Surgical History Past Surgical History:  Procedure Laterality Date  . WISDOM TOOTH EXTRACTION      Family History Family History  Problem Relation Age of Onset  . Hypertension Mother   . Hyperlipidemia Mother   . Stroke Maternal Grandmother   . Hypertension Maternal Grandfather   . Diabetes Paternal Grandmother      Social History  reports that she has never smoked. She has never used smokeless tobacco. She reports that she does not drink alcohol or use drugs.  Medications  Current Outpatient Medications:  .  etonogestrel (NEXPLANON) 68 MG IMPL implant, 1 each by Subdermal route once., Disp: , Rfl:  .  loratadine-pseudoephedrine (CLARITIN-D 12 HOUR) 5-120 MG per tablet, Take 1 tablet by mouth 2 (two) times daily., Disp: 14 tablet, Rfl: 0 .  EPINEPHrine 0.3 mg/0.3 mL IJ SOAJ injection, INJECT 1 SYRINGE UTD PRF SYSTEMIC REACTION, Disp: , Rfl: 1 .  megestrol (MEGACE) 40 MG tablet, take 3 tabs x 5 days then 2 x 5 days then 1 daily (Patient not taking: Reported on 04/18/2017), Disp: 45 tablet, Rfl: 1  Allergies Peanut-containing drug products and Other  Review of Systems Review of Systems - Oncology ROS as per HPI otherwise 12 point ROS is negative.   Physical Exam  Vitals Wt Readings from Last 3 Encounters:  04/04/17 283 lb (128.4 kg) (>99 %, Z= 2.72)*  04/03/17 284 lb 3.2 oz (128.9 kg) (>99 %, Z= 2.73)*  03/28/17  283 lb 4 oz (128.5 kg) (>99 %, Z= 2.72)*   * Growth percentiles are based on CDC (Girls, 2-20 Years) data.   Temp Readings from Last 3 Encounters:  04/04/17 98.3 F (36.8 C) (Temporal)  04/03/17 (!) 97.4 F (36.3 C) (Oral)  03/28/17 98.2 F (36.8 C) (Temporal)   BP Readings from Last 3 Encounters:  04/04/17 124/82  04/03/17 136/86  03/28/17 126/84   Pulse Readings from Last 3 Encounters:  04/04/17 60  04/03/17 (!) 54  03/28/17 78   Constitutional:  Well-developed, well-nourished, and in no distress.   HENT: Head: Normocephalic and atraumatic.  Mouth/Throat: No oropharyngeal exudate. Mucosa moist. Eyes: Pupils are equal, round, and reactive to light. Conjunctivae are normal. No scleral icterus.  Neck: Normal range of motion. Neck supple. No JVD present.  Cardiovascular: Normal rate, regular rhythm and normal heart sounds.  Exam reveals no gallop and no friction rub.   No murmur heard. Pulmonary/Chest: Effort normal and breath sounds normal. No respiratory distress. No wheezes.No rales.  Abdominal: Soft. Bowel sounds are normal. No distension. There is no tenderness. There is no guarding.  Musculoskeletal: No edema or tenderness.  Lymphadenopathy: No cervical, AXILLARY or supraclavicular adenopathy.  Neurological: Alert and oriented to person, place, and time. No cranial nerve deficit.  Skin: Skin is warm and dry. No rash noted. No erythema. No pallor.  Psychiatric: Affect and judgment normal.   Labs No visits with results within 3 Day(s) from this visit.  Latest known visit with results is:  Appointment on 04/03/2017  Component Date Value Ref Range Status  . WBC 04/03/2017 4.9  4.0 - 10.5 K/uL Final  . RBC 04/03/2017 4.16  3.87 - 5.11 MIL/uL Final  . Hemoglobin 04/03/2017 10.0* 12.0 - 15.0 g/dL Final  . HCT 04/03/2017 32.2* 36.0 - 46.0 % Final  . MCV 04/03/2017 77.4* 78.0 - 100.0 fL Final  . MCH 04/03/2017 24.0* 26.0 - 34.0 pg Final  . MCHC 04/03/2017 31.1  30.0 - 36.0 g/dL Final  . RDW 04/03/2017 16.1* 11.5 - 15.5 % Final  . Platelets 04/03/2017 474* 150 - 400 K/uL Final  . Neutrophils Relative % 04/03/2017 46  % Final  . Neutro Abs 04/03/2017 2.3  1.7 - 7.7 K/uL Final  . Lymphocytes Relative 04/03/2017 40  % Final  . Lymphs Abs 04/03/2017 2.0  0.7 - 4.0 K/uL Final  . Monocytes Relative 04/03/2017 10  % Final  . Monocytes Absolute 04/03/2017 0.5  0.1 - 1.0 K/uL Final  . Eosinophils Relative 04/03/2017 4  % Final  .  Eosinophils Absolute 04/03/2017 0.2  0.0 - 0.7 K/uL Final  . Basophils Relative 04/03/2017 0  % Final  . Basophils Absolute 04/03/2017 0.0  0.0 - 0.1 K/uL Final   Performed at [facility], [address] [place], [place] [address]  . Sodium 04/03/2017 135  135 - 145 mmol/L Final  . Potassium 04/03/2017 3.9  3.5 - 5.1 mmol/L Final  . Chloride 04/03/2017 105  101 - 111 mmol/L Final  . CO2 04/03/2017 23  22 - 32 mmol/L Final  . Glucose, Bld 04/03/2017 86  65 - 99 mg/dL Final  . BUN 04/03/2017 12  6 - 20 mg/dL Final  . Creatinine, Ser 04/03/2017 0.63  0.44 - 1.00 mg/dL Final  . Calcium 04/03/2017 8.8* 8.9 - 10.3 mg/dL Final  . Total Protein 04/03/2017 7.7  6.5 - 8.1 g/dL Final  . Albumin 04/03/2017 3.4* 3.5 - 5.0 g/dL Final  . AST 04/03/2017 13*  15 - 41 U/L Final  . ALT 04/03/2017 12* 14 - 54 U/L Final  . Alkaline Phosphatase 04/03/2017 72  38 - 126 U/L Final  . Total Bilirubin 04/03/2017 0.4  0.3 - 1.2 mg/dL Final  . GFR calc non Af Amer 04/03/2017 >60  >60 mL/min Final  . GFR calc Af Amer 04/03/2017 >60  >60 mL/min Final   Comment: (NOTE) The eGFR has been calculated using the CKD EPI equation. This calculation has not been validated in all clinical situations. eGFR's persistently <60 mL/min signify possible Chronic Kidney Disease.   Georgiann Hahn gap 04/03/2017 7  5 - 15 Final   Performed at [facility], [address], [place], [place] [address]  . LDH 04/03/2017 98  98 - 192 U/L Final   Performed at [facility], [address] [place], [place] [address]  . Total Protein ELP 04/03/2017 7.2  6.0 - 8.5 g/dL Final  . Albumin ELP 04/03/2017 3.5  2.9 - 4.4 g/dL Final  . Alpha-1-Globulin 04/03/2017 0.2  0.0 - 0.4 g/dL Final  . Alpha-2-Globulin 04/03/2017 0.9  0.4 - 1.0 g/dL Final  . Beta Globulin 04/03/2017 1.2  0.7 - 1.3 g/dL Final  . Gamma Globulin 04/03/2017 1.4  0.4 - 1.8 g/dL Final  . M-Spike, % 04/03/2017 Not Observed  Not Observed g/dL Final  . SPE Interp. 04/03/2017 Comment   Final    Comment: (NOTE) The SPE pattern appears essentially unremarkable. Evidence of monoclonal protein is not apparent. Performed At: [facility] [place], [place] [id number] Rush Farmer MD [id number]   . Comment 04/03/2017 Comment   Final   Comment: (NOTE) Protein electrophoresis scan will follow via computer, mail, or courier delivery.   Marland Kitchen GLOBULIN, TOTAL 04/03/2017 3.7  2.2 - 3.9 g/dL Corrected  . A/G Ratio 04/03/2017 0.9  0.7 - 1.7 Corrected   Performed at [facility], [address], [place], [place] [address]  . Ferritin 04/03/2017 29  11 - 307 ng/mL Final   Performed at [facility] [address] [place], [place] [address]  . Vitamin B-12 04/03/2017 244  180 - 914 pg/mL Final   Comment: (NOTE) This assay is not validated for testing neonatal or myeloproliferative syndrome specimens for Vitamin B12 levels. Performed at [facility], [place] [address] [place], [place] [address]   . Folate 04/03/2017 5.1* >5.9 ng/mL Final   Performed at [facility], [place] [address] [place], [place] [address]  . Hgb A2 Quant 04/03/2017 2.2  1.8 - 3.2 % Final  . Hgb F Quant 04/03/2017 0.0  0.0 - 2.0 % Final  . Hgb S Quant 04/03/2017 0.0  0.0 % Final  . Hgb C 04/03/2017 0.0  0.0 % Corrected  . Hgb A 04/03/2017 97.8  96.4 - 98.8 % Final  . Hgb Variant 04/03/2017 0.0  0.0 % Corrected  . Please Note: 04/03/2017 Comment   Corrected   Comment: (NOTE) Normal adult hemoglobin present. Performed At: [facility] [place], [place] [id number] Rush Farmer MD [id number] Performed at [facility], [address], [place], [place] [address]      Pathology Orders Placed This Encounter  Procedures  . CBC with Differential/Platelet    Standing Status:   Future    Standing Expiration Date:   04/19/2018  . Comprehensive metabolic panel    Standing Status:   Future    Standing Expiration Date:   04/19/2018  . Lactate dehydrogenase  Standing Status:   Future    Standing Expiration Date:   04/19/2018  . Ferritin    Standing Status:   Future    Standing Expiration Date:   04/19/2018       Zoila Shutter MD

## 2017-05-23 ENCOUNTER — Encounter: Payer: Medicaid Other | Admitting: Family Medicine

## 2017-06-13 ENCOUNTER — Encounter: Payer: Self-pay | Admitting: Family Medicine

## 2017-07-03 ENCOUNTER — Ambulatory Visit (INDEPENDENT_AMBULATORY_CARE_PROVIDER_SITE_OTHER): Payer: Medicaid Other | Admitting: Family Medicine

## 2017-07-03 ENCOUNTER — Other Ambulatory Visit: Payer: Self-pay

## 2017-07-03 ENCOUNTER — Other Ambulatory Visit (HOSPITAL_COMMUNITY)
Admission: RE | Admit: 2017-07-03 | Discharge: 2017-07-03 | Disposition: A | Discharge status: Home / Self Care | Payer: Medicaid Other | Source: Ambulatory Visit | Attending: Family Medicine | Admitting: Family Medicine

## 2017-07-03 ENCOUNTER — Encounter: Payer: Self-pay | Admitting: Family Medicine

## 2017-07-03 VITALS — BP 132/84 | HR 60 | Temp 98.2°F | Resp 20 | Ht 65.5 in | Wt 281.1 lb

## 2017-07-03 DIAGNOSIS — Z113 Encounter for screening for infections with a predominantly sexual mode of transmission: Secondary | ICD-10-CM

## 2017-07-03 DIAGNOSIS — Z0000 Encounter for general adult medical examination without abnormal findings: Secondary | ICD-10-CM | POA: Diagnosis not present

## 2017-07-03 DIAGNOSIS — Z975 Presence of (intrauterine) contraceptive device: Secondary | ICD-10-CM

## 2017-07-03 DIAGNOSIS — N921 Excessive and frequent menstruation with irregular cycle: Secondary | ICD-10-CM | POA: Diagnosis not present

## 2017-07-03 DIAGNOSIS — D649 Anemia, unspecified: Secondary | ICD-10-CM

## 2017-07-03 NOTE — Progress Notes (Signed)
Patient ID: Brandi Madden, female    DOB: April 23, 1995, 22 y.o.   MRN: [id number]  Chief Complaint  Patient presents with  . Annual Exam    Allergies Peanut-containing drug products and Other  Subjective:   Brandi Madden is a 22 y.o. female who presents to [facility] today.  HPI Here for complete physical exam.  [facility]ce[facility]  Reports that several months ago she became sexually active and had penile/vaginal intercourse.  Would like to be checked for any sexually transmitted infections.  Does have Implanon for contraceptive management which was placed in 2017.  Was recently seen by hematology oncology secondary to anemia.  Is taking her iron therapy as directed.  Has an upcoming follow-up with them in the next couple months.  Is supposed to follow-up with [facility] for heavy menses.  It is thought she has been anemic secondary to her menstrual cycles.  She reports that she feels good.  Energy level is good.  Denies any chest pain, shortness of breath, swelling in her extremities.  Bowel movements are regular.  Denies any constipation.  Her mood is good.  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  She is in school at [employer].  She denies any tobacco, alcohol, or drug use.  She attends church.  She enjoys spending time with her family and friends.  She exercises several days a week.  She reports that she understands she needs to lose weight.  She has been seen by orthopedics and denies any pain in her joints at this time.  She has a history of a peanut allergy and does have an EpiPen.  She has not had any exposures or problems and has not had to use an EpiPen.   Past Medical History:  Diagnosis Date  . Allergy   . Eczema   . Encounter for education about contraceptive use 06/29/2015  . Hyperlipemia   . Nexplanon insertion 07/21/2015   Inserted left arm 07/21/15    Past Surgical History:    Procedure Laterality Date  . WISDOM TOOTH EXTRACTION      Family History  Problem Relation Age of Onset  . Hypertension Mother   . Hyperlipidemia Mother   . Stroke Maternal Grandmother   . Hypertension Maternal Grandfather   . Diabetes Paternal Grandmother      Social History   Socioeconomic History  . Marital status: Single    Spouse name: Not on file  . Number of children: Not on file  . Years of education: Not on file  . Highest education level: Not on file  Occupational History  . Not on file  Social Needs  . Financial resource strain: Not on file  . Food insecurity:    Worry: Not on file    Inability: Not on file  . Transportation needs:    Medical: Not on file    Non-medical: Not on file  Tobacco Use  . Smoking status: Never Smoker  . Smokeless tobacco: Never Used  Substance and Sexual Activity  . Alcohol use: No  . Drug use: No  . Sexual activity: Not Currently    Birth control/protection: Implant  Lifestyle  . Physical activity:    Days per week: 3 days    Minutes per session: 40 min  . Stress: Not at all  Relationships  . Social connections:    Talks on phone: More than three times  a week    Gets together: More than three times a week    Attends religious service: More than 4 times per year    Active member of club or organization: Yes    Attends meetings of clubs or organizations: More than 4 times per year    Relationship status: Never married  Other Topics Concern  . Not on file  Social History Narrative   Live in [place], [place].   Has a younger sister, 33 y/o.   Married.    Eats all food groups.   Has a car at school.   No tickets. Wear seatbelt.       [facility].    Current Outpatient Medications on File Prior to Visit  Medication Sig Dispense Refill  . EPINEPHrine 0.3 mg/0.3 mL IJ SOAJ injection INJECT 1 SYRINGE UTD PRF SYSTEMIC REACTION  1  . etonogestrel (NEXPLANON) 68 MG IMPL implant 1 each by Subdermal route once.    .  loratadine-pseudoephedrine (CLARITIN-D 12 HOUR) 5-120 MG per tablet Take 1 tablet by mouth 2 (two) times daily. 14 tablet 0   No current facility-administered medications on file prior to visit.     Review of Systems   Objective:   BP 132/84 (BP Location: Right Arm, Patient Position: Sitting, Cuff Size: Large)   Pulse 60   Temp 98.2 F (36.8 C) (Temporal)   Resp 20   Ht 5' 5.5" (1.664 m)   Wt 281 lb 1.9 oz (127.5 kg)   BMI 46.07 kg/m   Physical Exam  Constitutional: She is oriented to person, place, and time. She appears well-developed and well-nourished. No distress.  HENT:  Head: Normocephalic and atraumatic.  Right Ear: External ear normal.  Left Ear: External ear normal.  Nose: Nose normal.  Mouth/Throat: Oropharynx is clear and moist. No oropharyngeal exudate.  Eyes: Pupils are equal, round, and reactive to light. Conjunctivae and EOM are normal. Right eye exhibits no discharge. Left eye exhibits no discharge. No scleral icterus.  Neck: Normal range of motion. Neck supple. No JVD present. No tracheal deviation present. No thyromegaly present.  Cardiovascular: Normal rate, regular rhythm, normal heart sounds and intact distal pulses.  Pulmonary/Chest: Effort normal and breath sounds normal. No respiratory distress. She has no wheezes. She has no rales.  Abdominal: Soft. Bowel sounds are normal. She exhibits no distension and no mass. There is no tenderness. There is no rebound and no guarding.  Musculoskeletal: Normal range of motion.  Lymphadenopathy:    She has no cervical adenopathy.  Neurological: She is alert and oriented to person, place, and time. She displays normal reflexes. No cranial nerve deficit. She exhibits normal muscle tone. Coordination normal.  Skin: Skin is warm and dry. Capillary refill takes less than 2 seconds. No rash noted. No erythema. No pallor.  Psychiatric: She has a normal mood and affect. Her behavior is normal. Judgment and thought content  normal.  Nursing note and vitals reviewed.  Depression screen [facility] 2/9 07/03/2017 02/14/2017 10/13/2015  Decreased Interest 0 0 0  Down, Depressed, Hopeless 0 0 0  PHQ - 2 Score 0 0 0  Altered sleeping - - 0  Tired, decreased energy - - 0  Change in appetite - - 0  Feeling bad or failure about yourself  - - 0  Trouble concentrating - - 0  Moving slowly or fidgety/restless - - 0  Suicidal thoughts - - 0  PHQ-9 Score - - 0     Assessment and Plan  1. Well adult exam Age-appropriate anticipatory guidance was given and discussed with patient. Safe sexual practices recommended. Vision exam recommended yearly. Dentist visit recommended every 6 months. - Lipid panel  2. Morbid obesity (HCC) Diet, exercise, and weight loss modifications recommended.  Weight loss to decrease risk of diabetes and cardiovascular risk reduction recommended.  3. Irregular intermenstrual bleeding Referral placed today but patient reports she will call and schedule this appointment. - Ambulatory referral to Obstetrics / Gynecology  4. Nexplanon in place - Ambulatory referral to Obstetrics / Gynecology  5. Screen for STD (sexually transmitted disease) Screening performed today per patient request. - Hepatitis panel, acute - HIV antibody - RPR - Urine cytology ancillary only  6. Anemia, unspecified type Continue iron supplementation and keep scheduled follow-up with Dr. Lambert ModyHiggs/Heme. - Ambulatory referral to Obstetrics / Gynecology  Return if symptoms worsen or fail to improve, for 6 months . Aliene Beamsachel Taggart Prasad, MD 07/03/2017

## 2017-07-03 NOTE — Patient Instructions (Signed)
Follow up with Dr. Melton AlarHiggs in July 2019 Schedule with [facility] regarding your periods. Come back for your labs-FASTING

## 2017-07-04 ENCOUNTER — Encounter: Payer: Self-pay | Admitting: Family Medicine

## 2017-07-04 LAB — URINE CYTOLOGY ANCILLARY ONLY
Chlamydia: NEGATIVE
NEISSERIA GONORRHEA: NEGATIVE
TRICH (WINDOWPATH): NEGATIVE

## 2017-07-09 ENCOUNTER — Encounter: Payer: Self-pay | Admitting: Family Medicine

## 2017-07-09 LAB — LIPID PANEL
CHOL/HDL RATIO: 5 (calc) — AB (ref <5.0)
CHOLESTEROL: 221 mg/dL — AB (ref <170)
HDL: 44 mg/dL — AB (ref >45)
LDL CHOLESTEROL (CALC): 159 mg/dL — AB (ref <110)
Non-HDL Cholesterol (Calc): 177 mg/dL (calc) — ABNORMAL HIGH (ref <120)
TRIGLYCERIDES: 75 mg/dL (ref <90)

## 2017-07-09 LAB — RPR: RPR: NONREACTIVE

## 2017-07-09 LAB — HEPATITIS PANEL, ACUTE
HEP A IGM: NONREACTIVE
HEP B C IGM: NONREACTIVE
HEP C AB: NONREACTIVE
Hepatitis B Surface Ag: NONREACTIVE
SIGNAL TO CUT-OFF: 0.02 (ref <1.00)

## 2017-07-09 LAB — HIV ANTIBODY (ROUTINE TESTING W REFLEX): HIV: NONREACTIVE

## 2017-08-11 ENCOUNTER — Other Ambulatory Visit (HOSPITAL_COMMUNITY): Payer: Medicaid Other

## 2017-08-13 ENCOUNTER — Other Ambulatory Visit (HOSPITAL_COMMUNITY): Payer: Self-pay

## 2017-08-13 DIAGNOSIS — D500 Iron deficiency anemia secondary to blood loss (chronic): Secondary | ICD-10-CM

## 2017-08-13 DIAGNOSIS — D6489 Other specified anemias: Secondary | ICD-10-CM

## 2017-08-13 NOTE — Addendum Note (Signed)
Addended by: Arnetha GulaBARNES, Ramar Nobrega R on: 08/13/2017 03:29 PM   Modules accepted: Orders

## 2017-08-15 ENCOUNTER — Other Ambulatory Visit (HOSPITAL_COMMUNITY): Payer: PRIVATE HEALTH INSURANCE

## 2017-08-18 ENCOUNTER — Ambulatory Visit (HOSPITAL_COMMUNITY): Payer: Medicaid Other | Admitting: Internal Medicine

## 2017-08-19 ENCOUNTER — Ambulatory Visit (HOSPITAL_COMMUNITY): Payer: Medicaid Other | Admitting: Internal Medicine

## 2017-08-26 ENCOUNTER — Ambulatory Visit: Payer: PRIVATE HEALTH INSURANCE | Admitting: Family Medicine

## 2018-09-18 ENCOUNTER — Other Ambulatory Visit: Payer: Self-pay

## 2018-09-18 ENCOUNTER — Ambulatory Visit
Admission: EM | Admit: 2018-09-18 | Discharge: 2018-09-18 | Disposition: A | Discharge status: Home / Self Care | Payer: Medicaid Other | Attending: Emergency Medicine | Admitting: Emergency Medicine

## 2018-09-18 DIAGNOSIS — J029 Acute pharyngitis, unspecified: Secondary | ICD-10-CM | POA: Diagnosis present

## 2018-09-18 DIAGNOSIS — Z20822 Contact with and (suspected) exposure to covid-19: Secondary | ICD-10-CM

## 2018-09-18 DIAGNOSIS — R5383 Other fatigue: Secondary | ICD-10-CM

## 2018-09-18 DIAGNOSIS — H9202 Otalgia, left ear: Secondary | ICD-10-CM

## 2018-09-18 DIAGNOSIS — R6889 Other general symptoms and signs: Secondary | ICD-10-CM | POA: Diagnosis present

## 2018-09-18 DIAGNOSIS — Z20828 Contact with and (suspected) exposure to other viral communicable diseases: Secondary | ICD-10-CM

## 2018-09-18 DIAGNOSIS — R0981 Nasal congestion: Secondary | ICD-10-CM

## 2018-09-18 HISTORY — DX: Essential (primary) hypertension: I10

## 2018-09-18 LAB — POCT RAPID STREP A (OFFICE): Rapid Strep A Screen: NEGATIVE

## 2018-09-18 MED ORDER — CETIRIZINE-PSEUDOEPHEDRINE ER 5-120 MG PO TB12: 1.0000 | ORAL_TABLET | Freq: Every day | ORAL | 0 refills | Status: DC

## 2018-09-18 MED ORDER — FLUTICASONE PROPIONATE 50 MCG/ACT NA SUSP: 2.0000 | Freq: Every day | NASAL | 0 refills | Status: DC

## 2018-09-18 MED ORDER — AMOXICILLIN 500 MG PO CAPS: 500.0000 mg | ORAL_CAPSULE | Freq: Two times a day (BID) | ORAL | 0 refills | Status: AC

## 2018-09-18 NOTE — Discharge Instructions (Addendum)
Strep test negative.  Culture sent.  We will call you with abnormal results COVID testing ordered.  It will take between 5- 7 days for results  In the meantime: You should remain isolated in your home for 10 days from symptom onset AND greater than 72 hours after symptoms resolution (absence of fever without the use of fever-reducing medication and improvement in respiratory symptoms), whichever is longer Get plenty of rest and push fluids Zyrtec-D prescribed for nasal congestion, runny nose, and/or sore throat Flonase prescribed for nasal congestion and runny nose Use medications daily for symptom relief Use OTC medications like ibuprofen or tylenol as needed fever or pain Call or go to the ED if you have any new or worsening symptoms such as fever, worsening cough, shortness of breath, chest tightness, chest pain, turning blue, changes in mental status, etc..Marland Kitchen

## 2018-09-18 NOTE — ED Provider Notes (Signed)
[facility]   [id number] 09/18/18 Arrival Time: 1046   CC: flu like symptoms; sore throat  SUBJECTIVE: History from: patient.  Brandi Madden is a 23 y.o. female who presents with left ear pain, sore throat, nasal congestion, body aches, and fatigue x 2 days.  Denies sick exposure to COVID, flu or strep.  Denies recent travel.  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  Has tried OTC ibuprofen and congestion medication with minimal relief.  Symptoms are made worse with swallowing, but tolerating liquids and own secretions without diffiuclty.  Reports previous symptoms in the past related to ear infection.   Denies fever, chills, rhinorrhea, cough, SOB, wheezing, chest pain, nausea, vomiting, changes in bowel or bladder habits.    ROS: As per HPI.  All other pertinent ROS negative.     Past Medical History:  Diagnosis Date  . Allergy   . Eczema   . Encounter for education about contraceptive use 06/29/2015  . Hyperlipemia   . Hypertension   . Nexplanon insertion 07/21/2015   Inserted left arm 07/21/15   Past Surgical History:  Procedure Laterality Date  . WISDOM TOOTH EXTRACTION     Allergies  Allergen Reactions  . Peanut-Containing Drug Products Other (See Comments) and Itching  . Other Hives, Diarrhea and Itching    Hazelnut    No current facility-administered medications on file prior to encounter.    Current Outpatient Medications on File Prior to Encounter  Medication Sig Dispense Refill  . EPINEPHrine 0.3 mg/0.3 mL IJ SOAJ injection INJECT 1 SYRINGE UTD PRF SYSTEMIC REACTION  1  . loratadine-pseudoephedrine (CLARITIN-D 12 HOUR) 5-120 MG per tablet Take 1 tablet by mouth 2 (two) times daily. 14 tablet 0  . [DISCONTINUED] etonogestrel (NEXPLANON) 68 MG IMPL implant 1 each by Subdermal route once.     Social History   Socioeconomic History  . Marital status: Single    Spouse name: Not on file  . Number of  children: Not on file  . Years of education: Not on file  . Highest education level: Not on file  Occupational History  . Not on file  Social Needs  . Financial resource strain: Not on file  . Food insecurity    Worry: Not on file    Inability: Not on file  . Transportation needs    Medical: Not on file    Non-medical: Not on file  Tobacco Use  . Smoking status: Never Smoker  . Smokeless tobacco: Never Used  Substance and Sexual Activity  . Alcohol use: Yes    Comment: occasionally  . Drug use: No  . Sexual activity: Not Currently    Birth control/protection: Implant  Lifestyle  . Physical activity    Days per week: 3 days    Minutes per session: 40 min  . Stress: Not at all  Relationships  . Social connections    Talks on phone: More than three times a week    Gets together: More than three times a week    Attends religious service: More than 4 times per year    Active member of club or organization: Yes    Attends meetings of clubs or organizations: More than 4 times per year    Relationship status: Never married  . Intimate partner violence    Fear of current or ex partner: No    Emotionally abused: No    Physically abused: No  Forced sexual activity: No  Other Topics Concern  . Not on file  Social History Narrative   Live in [place], [place].   Has a younger sister, 59 y/o.   Married.    Eats all food groups.   Has a car at school.   No tickets. Wear seatbelt.       [facility].    Family History  Problem Relation Age of Onset  . Hypertension Mother   . Hyperlipidemia Mother   . Stroke Maternal Grandmother   . Hypertension Maternal Grandfather   . Diabetes Paternal Grandmother     OBJECTIVE:  Vitals:   09/18/18 1103  BP: 135/83  Pulse: 78  Resp: 18  Temp: 99.8 F (37.7 C)  TempSrc: Oral  SpO2: 98%     General appearance: alert; appears mildly fatigued, but nontoxic; speaking in full sentences and tolerating own secretions HEENT:  NCAT; Ears: EACs clear, TMs pearly gray; Eyes: PERRL.  EOM grossly intact. Nose: nares patent without rhinorrhea, Throat: oropharynx clear, lt tonsil 3+ with erythema and white exudate, RT tonsil 1-2+ and erythematous, uvula midline  Neck: supple without LAD Lungs: unlabored respirations, symmetrical air entry; cough: absent; no respiratory distress; CTAB Heart: regular rate and rhythm.  Radial pulses 2+ symmetrical bilaterally Skin: warm and dry Psychological: alert and cooperative; normal mood and affect  ASSESSMENT & PLAN:  1. Suspected Covid-19 Virus Infection   2. Flu-like symptoms     Meds ordered this encounter  Medications  . DISCONTD: cetirizine-pseudoephedrine (ZYRTEC-D) 5-120 MG tablet    Sig: Take 1 tablet by mouth daily.    Dispense:  30 tablet    Refill:  0    Order Specific Question:   Supervising Provider    Answer:   Raylene Everts [[id number]]  . DISCONTD: fluticasone (FLONASE) 50 MCG/ACT nasal spray    Sig: Place 2 sprays into both nostrils daily.    Dispense:  16 g    Refill:  0    Order Specific Question:   Supervising Provider    Answer:   Raylene Everts [[id number]]  . amoxicillin (AMOXIL) 500 MG capsule    Sig: Take 1 capsule (500 mg total) by mouth 2 (two) times daily for 10 days.    Dispense:  20 capsule    Refill:  0    Order Specific Question:   Supervising Provider    Answer:   Raylene Everts [[id number]]  . cetirizine-pseudoephedrine (ZYRTEC-D) 5-120 MG tablet    Sig: Take 1 tablet by mouth daily.    Dispense:  30 tablet    Refill:  0    Order Specific Question:   Supervising Provider    Answer:   Raylene Everts [[id number]]  . fluticasone (FLONASE) 50 MCG/ACT nasal spray    Sig: Place 2 sprays into both nostrils daily.    Dispense:  16 g    Refill:  0    Order Specific Question:   Supervising Provider    Answer:   Raylene Everts [[id number]]    Strep test negative.  Culture sent.  We will call you with abnormal results However, based  on physical exam I will treat you for possible strep infection COVID testing ordered.  It will take between 5- 7 days for results  In the meantime: You should remain isolated in your home for 10 days from symptom onset AND greater than 72 hours after symptoms resolution (absence of fever without the use of fever-reducing medication and  improvement in respiratory symptoms), whichever is longer Get plenty of rest and push fluids Zyrtec-D prescribed for nasal congestion, runny nose, and/or sore throat Flonase prescribed for nasal congestion and runny nose Use medications daily for symptom relief Use OTC medications like ibuprofen or tylenol as needed fever or pain Call or go to the ED if you have any new or worsening symptoms such as fever, worsening cough, shortness of breath, chest tightness, chest pain, turning blue, changes in mental status, etc...    Reviewed expectations re: course of current medical issues. Questions answered. Outlined signs and symptoms indicating need for more acute intervention. Patient verbalized understanding. After Visit Summary given.         Rennis Harding, PA-C 09/18/18 1147

## 2018-09-18 NOTE — ED Triage Notes (Signed)
Pt presents to UC w/ c/o headache, sore throat, ear pain, muscle aches x2 days. Pt reports taking congestion medication without relief of symptoms. Pt also took ibuprofen which helped with headache.

## 2018-09-20 LAB — NOVEL CORONAVIRUS, NAA: SARS-CoV-2, NAA: NOT DETECTED

## 2018-09-20 LAB — CULTURE, GROUP A STREP (THRC)

## 2018-09-21 ENCOUNTER — Encounter (HOSPITAL_COMMUNITY): Payer: Self-pay

## 2018-09-24 ENCOUNTER — Other Ambulatory Visit: Payer: Self-pay | Admitting: Nurse Practitioner

## 2018-09-24 DIAGNOSIS — Z30431 Encounter for routine checking of intrauterine contraceptive device: Secondary | ICD-10-CM

## 2018-09-30 ENCOUNTER — Ambulatory Visit (HOSPITAL_COMMUNITY)
Admission: RE | Admit: 2018-09-30 | Discharge: 2018-09-30 | Disposition: A | Discharge status: Home / Self Care | Payer: Self-pay | Source: Ambulatory Visit | Attending: Nurse Practitioner | Admitting: Nurse Practitioner

## 2018-09-30 ENCOUNTER — Other Ambulatory Visit: Payer: Self-pay

## 2018-09-30 DIAGNOSIS — Z30431 Encounter for routine checking of intrauterine contraceptive device: Secondary | ICD-10-CM | POA: Insufficient documentation

## 2019-02-10 ENCOUNTER — Other Ambulatory Visit: Payer: Self-pay

## 2019-02-10 ENCOUNTER — Ambulatory Visit: Payer: Medicaid Other | Attending: Internal Medicine

## 2019-02-10 DIAGNOSIS — Z20822 Contact with and (suspected) exposure to covid-19: Secondary | ICD-10-CM

## 2019-02-11 LAB — NOVEL CORONAVIRUS, NAA: SARS-CoV-2, NAA: NOT DETECTED

## 2019-11-22 ENCOUNTER — Other Ambulatory Visit: Payer: Self-pay

## 2019-11-22 ENCOUNTER — Ambulatory Visit
Admission: EM | Admit: 2019-11-22 | Discharge: 2019-11-22 | Disposition: A | Discharge status: Home / Self Care | Payer: Medicaid Other | Attending: Emergency Medicine | Admitting: Emergency Medicine

## 2019-11-22 DIAGNOSIS — J069 Acute upper respiratory infection, unspecified: Secondary | ICD-10-CM

## 2019-11-22 DIAGNOSIS — H65192 Other acute nonsuppurative otitis media, left ear: Secondary | ICD-10-CM

## 2019-11-22 DIAGNOSIS — Z1152 Encounter for screening for COVID-19: Secondary | ICD-10-CM

## 2019-11-22 DIAGNOSIS — H60311 Diffuse otitis externa, right ear: Secondary | ICD-10-CM

## 2019-11-22 MED ORDER — NEOMYCIN-POLYMYXIN-HC 3.5-10000-1 OT SUSP: 4.0000 [drp] | Freq: Three times a day (TID) | OTIC | 0 refills | Status: AC

## 2019-11-22 MED ORDER — CETIRIZINE HCL 10 MG PO TABS: 10.0000 mg | ORAL_TABLET | Freq: Every day | ORAL | 0 refills | Status: DC

## 2019-11-22 MED ORDER — BENZONATATE 100 MG PO CAPS: 100.0000 mg | ORAL_CAPSULE | Freq: Three times a day (TID) | ORAL | 0 refills | Status: DC

## 2019-11-22 MED ORDER — FLUTICASONE PROPIONATE 50 MCG/ACT NA SUSP: 1.0000 | Freq: Every day | NASAL | 0 refills | Status: DC

## 2019-11-22 NOTE — ED Provider Notes (Signed)
[facility]   [id number] 11/22/19 Arrival Time: 4098   CC: COVID symptoms  SUBJECTIVE: History from: patient.  Brandi Madden is a 24 y.o. female who presented to the urgent care with a complaint of nasal congestion, cough, ear pain for the past 3 to 4 days.  Denies sick exposure to COVID, flu or strep.  Denies recent travel.  Has tried OTC medication without relief.  Denies aggravating factor.  Denies previous symptoms in the past.   Denies fever, chills, fatigue, sinus pain, rhinorrhea, sore throat, SOB, wheezing, chest pain, nausea, changes in bowel or bladder habits.     ROS: As per HPI.  All other pertinent ROS negative.      Past Medical History:  Diagnosis Date   Allergy    Eczema    Encounter for education about contraceptive use 06/29/2015   Hyperlipemia    Hypertension    Nexplanon insertion 07/21/2015   Inserted left arm 07/21/15   Past Surgical History:  Procedure Laterality Date   WISDOM TOOTH EXTRACTION     Allergies  Allergen Reactions   Peanut-Containing Drug Products Other (See Comments) and Itching   Other Hives, Diarrhea and Itching    Hazelnut    No current facility-administered medications on file prior to encounter.   Current Outpatient Medications on File Prior to Encounter  Medication Sig Dispense Refill   cetirizine-pseudoephedrine (ZYRTEC-D) 5-120 MG tablet Take 1 tablet by mouth daily. 30 tablet 0   EPINEPHrine 0.3 mg/0.3 mL IJ SOAJ injection INJECT 1 SYRINGE UTD PRF SYSTEMIC REACTION  1   loratadine-pseudoephedrine (CLARITIN-D 12 HOUR) 5-120 MG per tablet Take 1 tablet by mouth 2 (two) times daily. 14 tablet 0   [DISCONTINUED] etonogestrel (NEXPLANON) 68 MG IMPL implant 1 each by Subdermal route once.     Social History   Socioeconomic History   Marital status: Single    Spouse name: Not on file   Number of children: Not on file   Years of education: Not on file   Highest education level: Not on file    Occupational History   Not on file  Tobacco Use   Smoking status: Never Smoker   Smokeless tobacco: Never Used  Vaping Use   Vaping Use: Never used  Substance and Sexual Activity   Alcohol use: Yes    Comment: occasionally   Drug use: No   Sexual activity: Not Currently    Birth control/protection: Implant  Other Topics Concern   Not on file  Social History Narrative   Live in [place], [place].   Has a younger sister, 42 y/o.   Married.    Eats all food groups.   Has a car at school.   No tickets. Wear seatbelt.       [facility].    Social Determinants of Health   Financial Resource Strain:    Difficulty of Paying Living Expenses: Not on file  Food Insecurity:    Worried About Programme researcher, broadcasting/film/video in the Last Year: Not on file   [employer] of Food in the Last Year: Not on file  Transportation Needs:    Lack of Transportation (Medical): Not on file   Lack of Transportation (Non-Medical): Not on file  Physical Activity:    Days of Exercise per Week: Not on file   Minutes of Exercise per Session: Not on file  Stress:    Feeling of Stress : Not on file  Social Connections:    Frequency of Communication  with Friends and Family: Not on file   Frequency of Social Gatherings with Friends and Family: Not on file   Attends Religious Services: Not on file   Active Member of Clubs or Organizations: Not on file   Attends Banker Meetings: Not on file   Marital Status: Not on file  Intimate Partner Violence:    Fear of Current or Ex-Partner: Not on file   Emotionally Abused: Not on file   Physically Abused: Not on file   Sexually Abused: Not on file   Family History  Problem Relation Age of Onset   Hypertension Mother    Hyperlipidemia Mother    Stroke Maternal Grandmother    Hypertension Maternal Grandfather    Diabetes Paternal Grandmother     OBJECTIVE:  Vitals:   11/22/19 0817  BP: 130/75  Pulse: 91  Resp: 18   Temp: 98.1 F (36.7 C)  SpO2: 98%     Physical Exam Vitals and nursing note reviewed.  Constitutional:      General: She is not in acute distress.    Appearance: Normal appearance. She is normal weight. She is not ill-appearing, toxic-appearing or diaphoretic.  HENT:     Head: Normocephalic.     Right Ear: Ear canal and external ear normal. Tenderness present. There is no impacted cerumen.     Left Ear: Ear canal and external ear normal. A middle ear effusion is present. There is no impacted cerumen.     Ears:     Comments: Tenderness and irritation in the right ear canal    Nose: Congestion present.     Mouth/Throat:     Mouth: Mucous membranes are moist.     Pharynx: No oropharyngeal exudate.  Cardiovascular:     Rate and Rhythm: Normal rate and regular rhythm.     Pulses: Normal pulses.     Heart sounds: Normal heart sounds. No murmur heard.  No friction rub. No gallop.   Pulmonary:     Effort: Pulmonary effort is normal. No respiratory distress.     Breath sounds: Normal breath sounds. No stridor. No wheezing, rhonchi or rales.  Chest:     Chest wall: No tenderness.  Neurological:     Mental Status: She is alert and oriented to person, place, and time.      LABS:  No results found for this or any previous visit (from the past 24 hour(s)).   ASSESSMENT & PLAN:  1. Encounter for screening for COVID-19   2. URI with cough and congestion   3. Acute middle ear effusion, left   4. Acute diffuse otitis externa of right ear     Meds ordered this encounter  Medications   benzonatate (TESSALON) 100 MG capsule    Sig: Take 1 capsule (100 mg total) by mouth every 8 (eight) hours.    Dispense:  30 capsule    Refill:  0   fluticasone (FLONASE) 50 MCG/ACT nasal spray    Sig: Place 1 spray into both nostrils daily for 14 days.    Dispense:  16 g    Refill:  0   cetirizine (ZYRTEC ALLERGY) 10 MG tablet    Sig: Take 1 tablet (10 mg total) by mouth daily.     Dispense:  30 tablet    Refill:  0   neomycin-polymyxin-hydrocortisone (CORTISPORIN) 3.5-10000-1 OTIC suspension    Sig: Place 4 drops into the right ear 3 (three) times daily.    Dispense:  10 mL  Refill:  0    Discharge Instructions  COVID testing ordered.  It will take between 2-7 days for test results.  Someone  will contact you regarding abnormal results.    In the meantime: You should remain isolated in your home for 10 days from symptom onset AND greater than 24 hours after symptoms resolution (absence of fever without the use of fever-reducing medication and improvement in respiratory symptoms), whichever is longer Get plenty of rest and push fluids Tessalon Perles prescribed for cough Zyrtec for nasal congestion, runny nose, and/or sore throat Flonase for nasal congestion and runny nose and left middle ear effusion Cortisporin was prescribed for right otitis externa Use medications daily for symptom relief Use OTC medications like ibuprofen or tylenol as needed fever or pain Call or go to the ED if you have any new or worsening symptoms such as fever, worsening cough, shortness of breath, chest tightness, chest pain, turning blue, changes in mental status, etc...   Reviewed expectations re: course of current medical issues. Questions answered. Outlined signs and symptoms indicating need for more acute intervention. Patient verbalized understanding. After Visit Summary given.         Durward Parcel, FNP 11/22/19 [phone]

## 2019-11-22 NOTE — ED Triage Notes (Signed)
Pt presents with nasal congestion, cough and ear pain, right is worse than left

## 2019-11-22 NOTE — Discharge Instructions (Signed)
COVID testing ordered.  It will take between 2-7 days for test results.  Someone  will contact you regarding abnormal results.    In the meantime: You should remain isolated in your home for 10 days from symptom onset AND greater than 24 hours after symptoms resolution (absence of fever without the use of fever-reducing medication and improvement in respiratory symptoms), whichever is longer Get plenty of rest and push fluids Tessalon Perles prescribed for cough Zyrtec for nasal congestion, runny nose, and/or sore throat Flonase for nasal congestion and runny nose and left middle ear effusion Cortisporin was prescribed for right otitis externa Use medications daily for symptom relief Use OTC medications like ibuprofen or tylenol as needed fever or pain Call or go to the ED if you have any new or worsening symptoms such as fever, worsening cough, shortness of breath, chest tightness, chest pain, turning blue, changes in mental status, etc..Marland Kitchen

## 2019-11-24 LAB — COVID-19, FLU A+B AND RSV
Influenza A, NAA: NOT DETECTED
Influenza B, NAA: NOT DETECTED
RSV, NAA: NOT DETECTED
SARS-CoV-2, NAA: NOT DETECTED

## 2020-10-15 ENCOUNTER — Encounter: Payer: Self-pay | Admitting: Emergency Medicine

## 2020-10-15 ENCOUNTER — Other Ambulatory Visit: Payer: Self-pay

## 2020-10-15 ENCOUNTER — Ambulatory Visit
Admission: EM | Admit: 2020-10-15 | Discharge: 2020-10-15 | Disposition: A | Discharge status: Home / Self Care | Payer: Medicaid Other | Attending: Family Medicine | Admitting: Family Medicine

## 2020-10-15 DIAGNOSIS — H01001 Unspecified blepharitis right upper eyelid: Secondary | ICD-10-CM

## 2020-10-15 MED ORDER — ERYTHROMYCIN 5 MG/GM OP OINT: TOPICAL_OINTMENT | OPHTHALMIC | 0 refills | Status: DC

## 2020-10-15 NOTE — ED Provider Notes (Signed)
[facility]    CSN: [id number] Arrival date & time: 10/15/20  1111      History   Chief Complaint Chief Complaint  Patient presents with   Facial Swelling    Right eye    HPI Brandi Madden is a 25 y.o. female.   Patient presenting today with 4-day history of right upper eyelid swelling, redness, tenderness and mild headache, irritation of the eye causing clear drainage.  Denies visual changes, fever, chills, thick drainage from the eye, eye redness to the globe, trauma to the eye, congestion.  Has been consistent with allergy regimen and taking ibuprofen for pain as needed.  No known sick contacts recently and no new make-ups or eye, facial products.  No known chronic eye issues.   Past Medical History:  Diagnosis Date   Allergy    Eczema    Encounter for education about contraceptive use 06/29/2015   Hyperlipemia    Hypertension    Nexplanon insertion 07/21/2015   Inserted left arm 07/21/15    Patient Active Problem List   Diagnosis Date Noted   Irregular intermenstrual bleeding 10/13/2015   Nexplanon in place 10/13/2015   Nexplanon insertion 07/21/2015   Encounter for education about contraceptive use 06/29/2015    Past Surgical History:  Procedure Laterality Date   WISDOM TOOTH EXTRACTION      OB History     Gravida  0   Para  0   Term  0   Preterm  0   AB  0   Living  0      SAB  0   IAB  0   Ectopic  0   Multiple  0   Live Births               Home Medications    Prior to Admission medications   Medication Sig Start Date End Date Taking? Authorizing Provider  erythromycin ophthalmic ointment Place a 1/2 inch ribbon of ointment into the right lower eyelid BID. 10/15/20  Yes Particia Nearing, PA-C  benzonatate (TESSALON) 100 MG capsule Take 1 capsule (100 mg total) by mouth every 8 (eight) hours. 11/22/19   Avegno, Zachery Dakins, FNP  cetirizine (ZYRTEC ALLERGY) 10 MG tablet Take 1 tablet (10 mg total) by mouth  daily. 11/22/19   Avegno, Zachery Dakins, FNP  cetirizine-pseudoephedrine (ZYRTEC-D) 5-120 MG tablet Take 1 tablet by mouth daily. 09/18/18   Wurst, [place], PA-C  EPINEPHrine 0.3 mg/0.3 mL IJ SOAJ injection INJECT 1 SYRINGE UTD PRF SYSTEMIC REACTION 09/15/15   [provider]  fluticasone (FLONASE) 50 MCG/ACT nasal spray Place 1 spray into both nostrils daily for 14 days. 11/22/19 12/06/19  Avegno, Zachery Dakins, FNP  loratadine-pseudoephedrine (CLARITIN-D 12 HOUR) 5-120 MG per tablet Take 1 tablet by mouth 2 (two) times daily. 06/05/13   Ivery Quale, PA-C  neomycin-polymyxin-hydrocortisone (CORTISPORIN) 3.5-10000-1 OTIC suspension Place 4 drops into the right ear 3 (three) times daily. 11/22/19   Avegno, Zachery Dakins, FNP  etonogestrel (NEXPLANON) 68 MG IMPL implant 1 each by Subdermal route once.  09/18/18  [provider]    Family History Family History  Problem Relation Age of Onset   Hypertension Mother    Hyperlipidemia Mother    Stroke Maternal Grandmother    Hypertension Maternal Grandfather    Diabetes Paternal Grandmother     Social History Social History   Tobacco Use   Smoking status: Never   Smokeless tobacco: Never  Vaping Use   Vaping  Use: Never used  Substance Use Topics   Alcohol use: Yes    Comment: occasionally   Drug use: No     Allergies   Peanut-containing drug products and Other   Review of Systems Review of Systems Per HPI  Physical Exam Triage Vital Signs ED Triage Vitals  Enc Vitals Group     BP 10/15/20 1303 128/78     Pulse Rate 10/15/20 1303 70     Resp 10/15/20 1303 17     Temp 10/15/20 1303 99.1 F (37.3 C)     Temp Source 10/15/20 1303 Oral     SpO2 10/15/20 1303 95 %     Weight 10/15/20 1307 285 lb (129.3 kg)     Height --      Head Circumference --      Peak Flow --      Pain Score 10/15/20 1307 7     Pain Loc --      Pain Edu? --      Excl. in GC? --    No data found.  Updated Vital Signs BP 128/78 (BP  Location: Right Arm)   Pulse 70   Temp 99.1 F (37.3 C) (Oral)   Resp 17   Wt 285 lb (129.3 kg)   SpO2 95%   BMI 46.71 kg/m   Visual Acuity Right Eye Distance: 25 Left Eye Distance: 20 Bilateral Distance: 20  Right Eye Near: R Near: 20 Left Eye Near:  L Near: 20 Bilateral Near:  38 (with glasses)  Physical Exam Vitals and nursing note reviewed.  Constitutional:      Appearance: Normal appearance. She is not ill-appearing.  HENT:     Head: Atraumatic.  Eyes:     Extraocular Movements: Extraocular movements intact.     Conjunctiva/sclera: Conjunctivae normal.     Pupils: Pupils are equal, round, and reactive to light.     Comments: Mild erythema and edema present on the right upper eyelid at the lash line.  Conjunctiva benign bilaterally with no drainage, EOMs intact  Cardiovascular:     Rate and Rhythm: Normal rate and regular rhythm.     Heart sounds: Normal heart sounds.  Pulmonary:     Effort: Pulmonary effort is normal.     Breath sounds: Normal breath sounds.  Musculoskeletal:        General: Normal range of motion.     Cervical back: Normal range of motion and neck supple.  Skin:    General: Skin is warm and dry.  Neurological:     Mental Status: She is alert and oriented to person, place, and time.  Psychiatric:        Mood and Affect: Mood normal.        Thought Content: Thought content normal.        Judgment: Judgment normal.     UC Treatments / Results  Labs (all labs ordered are listed, but only abnormal results are displayed) Labs Reviewed - No data to display  EKG   Radiology No results found.  Procedures Procedures (including critical care time)  Medications Ordered in UC Medications - No data to display  Initial Impression / Assessment and Plan / UC Course  I have reviewed the triage vital signs and the nursing notes.  Pertinent labs & imaging results that were available during my care of the patient were reviewed by me and  considered in my medical decision making (see chart for details).     Suspect early bacterial  blepharitis.  Will treat with erythromycin ointment, compresses.  Follow-up for worsening symptoms.  Work note given.  Final Clinical Impressions(s) / UC Diagnoses   Final diagnoses:  Blepharitis of right upper eyelid, unspecified type   Discharge Instructions   None    ED Prescriptions     Medication Sig Dispense Auth. Provider   erythromycin ophthalmic ointment Place a 1/2 inch ribbon of ointment into the right lower eyelid BID. 3.5 g Particia Nearing, PA-C      PDMP not reviewed this encounter.   Particia Nearing, [place] 10/15/20 1344

## 2020-10-15 NOTE — ED Triage Notes (Signed)
Right eye swelling since Thursday, blurry vision at times, and has headache.

## 2020-10-24 ENCOUNTER — Ambulatory Visit: Payer: Self-pay

## 2020-12-06 ENCOUNTER — Encounter: Payer: Self-pay | Admitting: Emergency Medicine

## 2020-12-06 ENCOUNTER — Ambulatory Visit
Admission: EM | Admit: 2020-12-06 | Discharge: 2020-12-06 | Disposition: A | Discharge status: Home / Self Care | Payer: Medicaid Other | Attending: Family Medicine | Admitting: Family Medicine

## 2020-12-06 ENCOUNTER — Other Ambulatory Visit: Payer: Self-pay

## 2020-12-06 DIAGNOSIS — J069 Acute upper respiratory infection, unspecified: Secondary | ICD-10-CM

## 2020-12-06 DIAGNOSIS — Z20828 Contact with and (suspected) exposure to other viral communicable diseases: Secondary | ICD-10-CM

## 2020-12-06 MED ORDER — OSELTAMIVIR PHOSPHATE 75 MG PO CAPS: 75.0000 mg | ORAL_CAPSULE | Freq: Two times a day (BID) | ORAL | 0 refills | Status: AC

## 2020-12-06 MED ORDER — PROMETHAZINE-DM 6.25-15 MG/5ML PO SYRP: 5.0000 mL | ORAL_SOLUTION | Freq: Four times a day (QID) | ORAL | 0 refills | Status: DC | PRN

## 2020-12-06 NOTE — ED Triage Notes (Signed)
Patient c/o generalized body aches, chills, and nonproductive cough x 3 days.   Patient endorses chest congestion.   Patient endorses Flu exposure.   Patient has taken Ibuprofen and Tylenol with no relief of symptoms.

## 2020-12-06 NOTE — ED Provider Notes (Signed)
[facility] CARE CENTER   [id number] 12/06/20 Arrival Time: 0830  ASSESSMENT & PLAN:  1. Exposure to the flu   2. Viral URI with cough   3. Exposure to influenza    Discussed typical duration of viral illnesses. COVID-19/influenza testing sent. OTC symptom care as needed.  Meds ordered this encounter  Medications   oseltamivir (TAMIFLU) 75 MG capsule    Sig: Take 1 capsule (75 mg total) by mouth 2 (two) times daily for 5 days.    Dispense:  10 capsule    Refill:  0   promethazine-dextromethorphan (PROMETHAZINE-DM) 6.25-15 MG/5ML syrup    Sig: Take 5 mLs by mouth 4 (four) times daily as needed for cough.    Dispense:  118 mL    Refill:  0     Follow-up Information     [facility].   Specialty: Urgent Care Why: As needed. Contact information: [address] [place] [address] [phone]                Reviewed expectations re: course of current medical issues. Questions answered. Outlined signs and symptoms indicating need for more acute intervention. Understanding verbalized. After Visit Summary given.   SUBJECTIVE: History from: patient. Brandi Madden is a 25 y.o. female who reports: influenza exposure. With body aches, chills, non-prod cough; x 3 days. Denies: difficulty breathing. Normal PO intake without n/v/d.   OBJECTIVE:  Vitals:   12/06/20 0936  BP: (!) 142/86  Pulse: (!) 101  Resp: 16  Temp: 99.6 F (37.6 C)  TempSrc: Oral  SpO2: 98%    General appearance: alert; no distress Eyes: PERRLA; EOMI; conjunctiva normal HENT: [place]; AT; with nasal congestion Neck: supple  Lungs: speaks full sentences without difficulty; unlabored; clear Extremities: no edema Skin: warm and dry Neurologic: normal gait Psychological: alert and cooperative; normal mood and affect  Labs:  Labs Reviewed  COVID-19, FLU A+B NAA    Allergies  Allergen Reactions   Peanut-Containing Drug Products  Other (See Comments) and Itching   Other Hives, Diarrhea and Itching    Hazelnut     Past Medical History:  Diagnosis Date   Allergy    Eczema    Encounter for education about contraceptive use 06/29/2015   Hyperlipemia    Hypertension    Nexplanon insertion 07/21/2015   Inserted left arm 07/21/15   Social History   Socioeconomic History   Marital status: Single    Spouse name: Not on file   Number of children: Not on file   Years of education: Not on file   Highest education level: Not on file  Occupational History   Not on file  Tobacco Use   Smoking status: Never   Smokeless tobacco: Never  Vaping Use   Vaping Use: Never used  Substance and Sexual Activity   Alcohol use: Yes    Comment: occasionally   Drug use: No   Sexual activity: Not Currently    Birth control/protection: Implant  Other Topics Concern   Not on file  Social History Narrative   Live in [place], [place].   Has a younger sister, 12 y/o.   Married.    Eats all food groups.   Has a car at school.   No tickets. Wear seatbelt.       [facility].    Social Determinants of Health   Financial Resource Strain: Not on file  Food Insecurity: Not on file  Transportation Needs:  Not on file  Physical Activity: Not on file  Stress: Not on file  Social Connections: Not on file  Intimate Partner Violence: Not on file   Family History  Problem Relation Age of Onset   Hypertension Mother    Hyperlipidemia Mother    Stroke Maternal Grandmother    Hypertension Maternal Grandfather    Diabetes Paternal Grandmother    Past Surgical History:  Procedure Laterality Date   WISDOM TOOTH EXTRACTION       Mardella Layman, MD 12/06/20 1021

## 2020-12-08 LAB — COVID-19, FLU A+B NAA
Influenza A, NAA: NOT DETECTED
Influenza B, NAA: NOT DETECTED
SARS-CoV-2, NAA: DETECTED — AB

## 2021-04-21 IMAGING — US US PELVIS COMPLETE WITH TRANSVAGINAL
1 series · 13 of 25 positions shown · non-contrast
Comparison: None

CLINICAL DATA: Intrauterine device surveillance; LMP 09/24/2018

EXAM:
TRANSABDOMINAL AND TRANSVAGINAL ULTRASOUND OF PELVIS
TECHNIQUE: Both transabdominal and transvaginal ultrasound examinations of the
pelvis were performed. Transabdominal technique was performed for
global imaging of the pelvis including uterus, ovaries, adnexal
regions, and pelvic cul-de-sac. It was necessary to proceed with
endovaginal exam following the transabdominal exam to visualize the
endometrium and ovaries.

[Series 1: us pelvis complete with transvaginal · 0.20mm/px · 177 acquisitions, 13 frames shown]
[im 1/177]
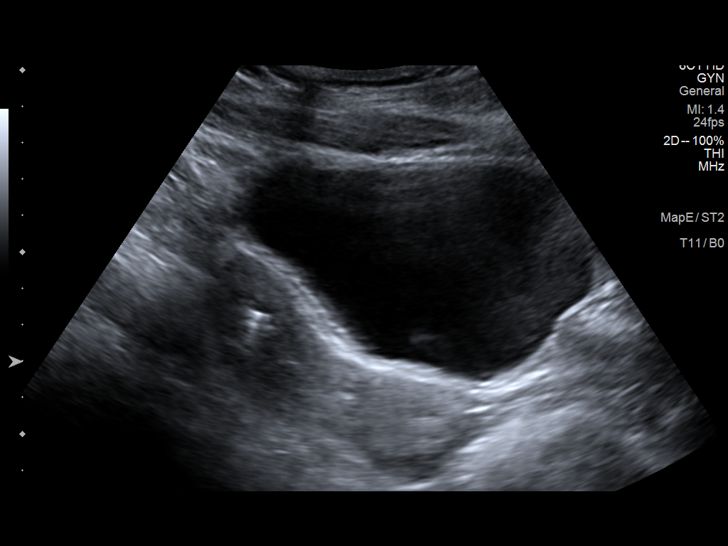
[im 15/177]
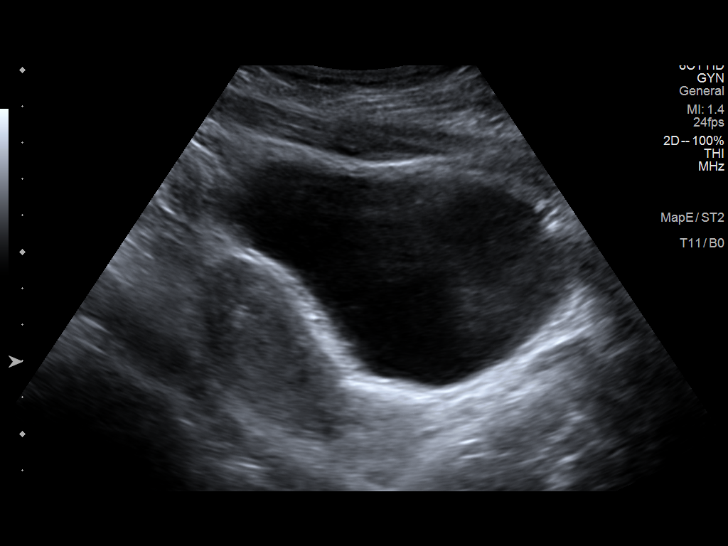
[im 30/177]
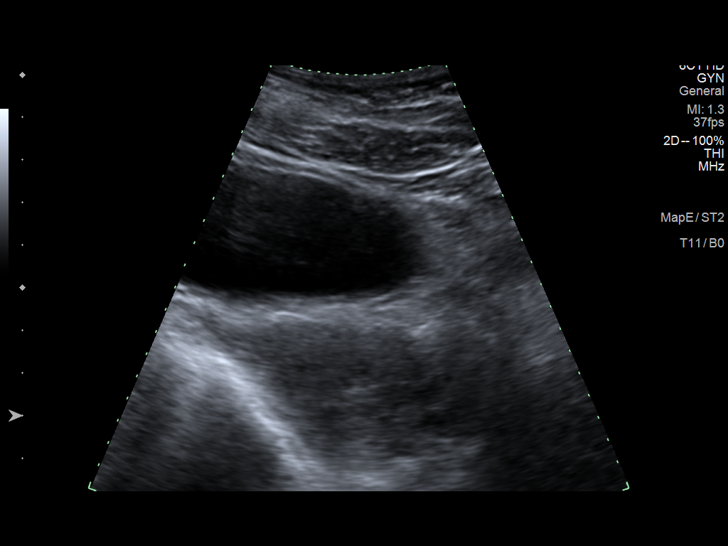
[im 45/177]
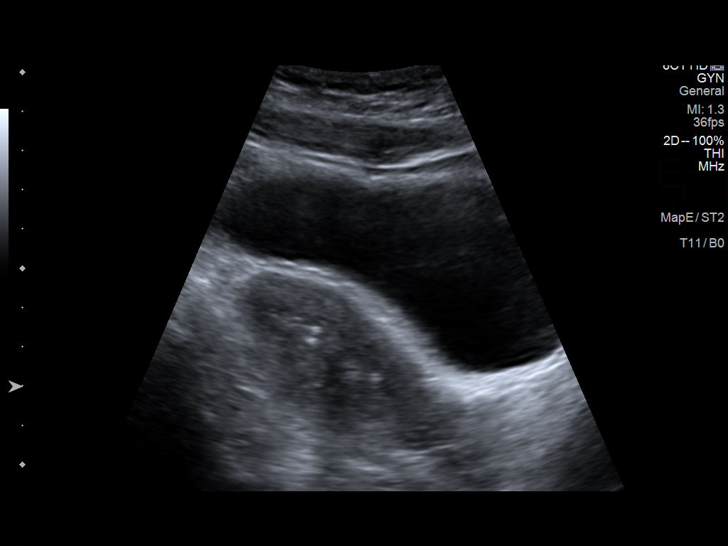
[im 59/177]
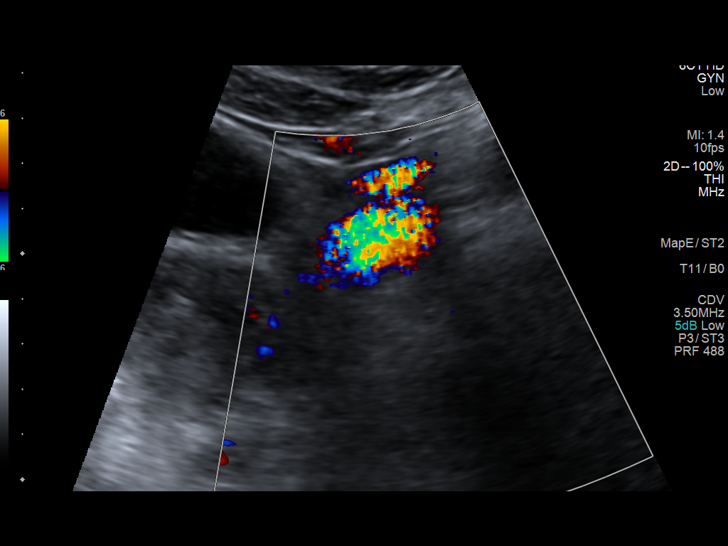
[im 74/177]
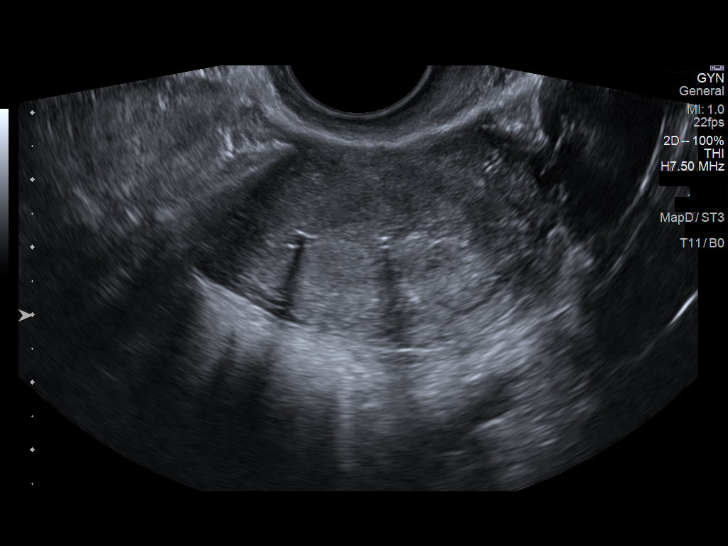
[im 89/177]
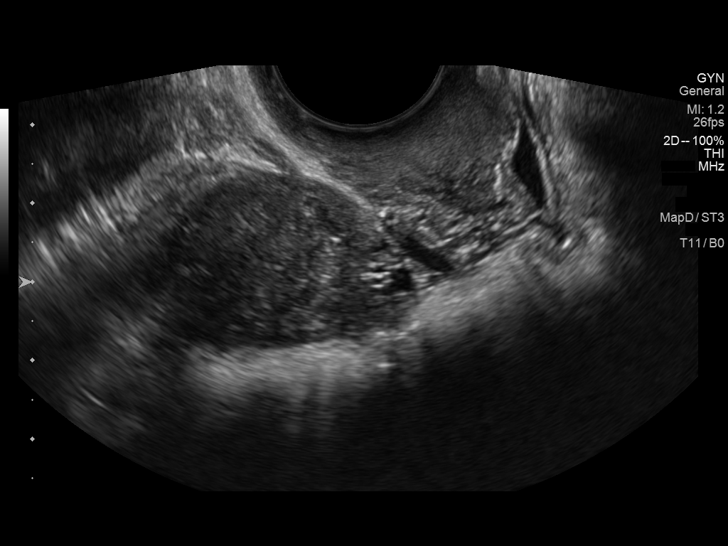
[im 103/177]
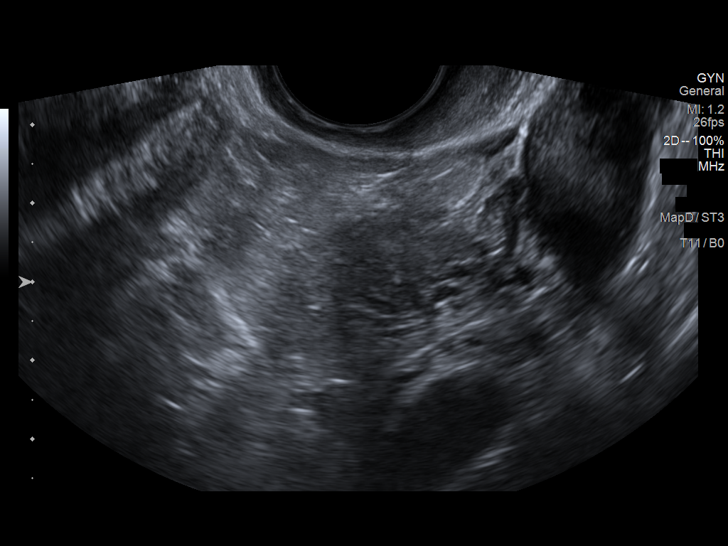
[im 118/177]
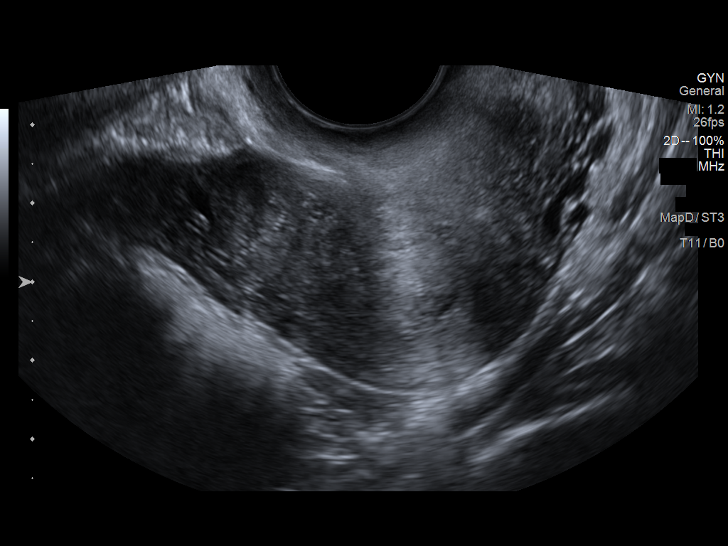
[im 133/177]
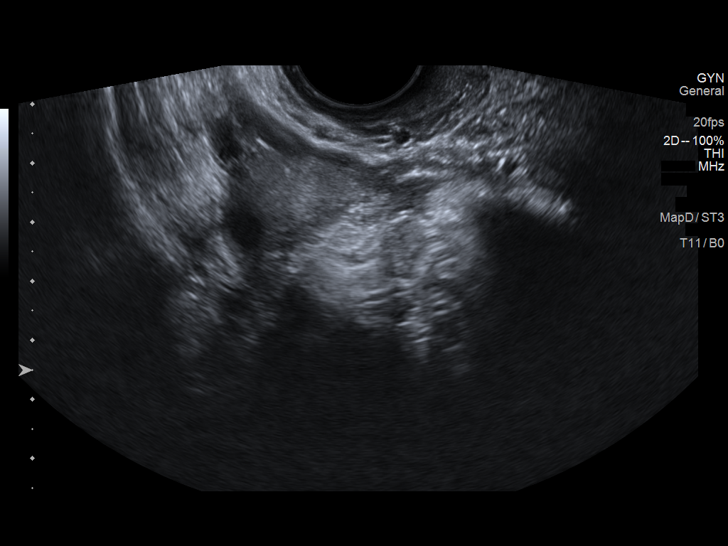
[im 147/177]
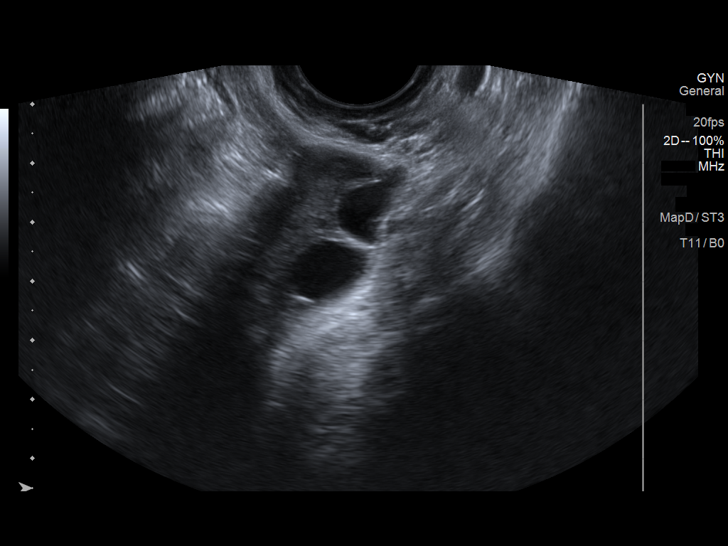
[im 162/177]
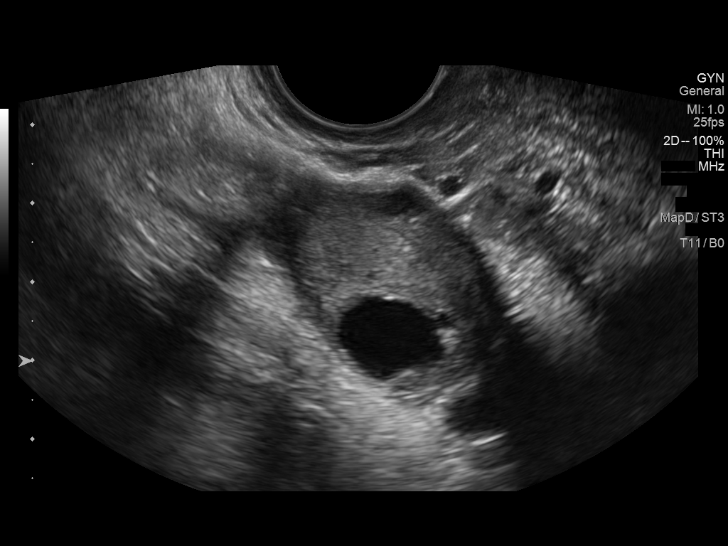
[im 177/177]
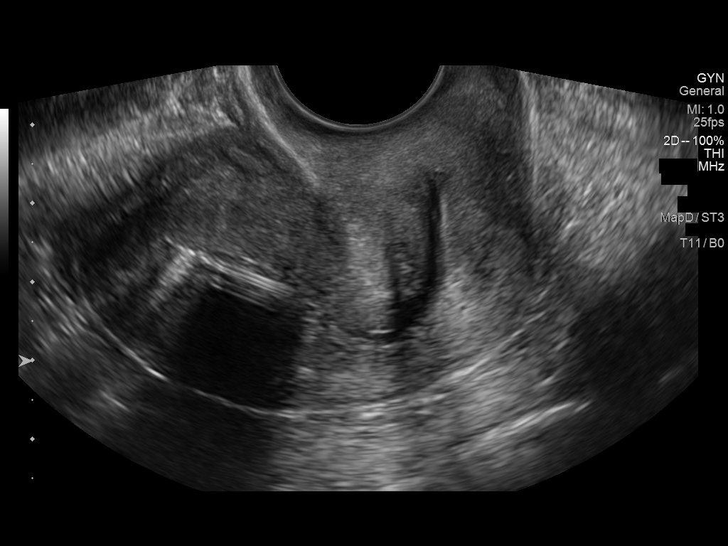

[13 of 25 positions shown; findings below may reference images not displayed]

FINDINGS: Uterus

Measurements: 8.2 x 2.8 x 3.7 cm = volume: 44 mL. Anteverted and
anteflexed. Normal morphology without mass

Endometrium

Thickness: 4 mm. IUD identified within endometrial complex at the
upper uterine segment in expected position. Small amount of fluid
within the endocervical canal. No focal endometrial abnormality.

Right ovary

Measurements: 3.0 x 1.6 x 2.5 cm = volume: 6.1 mL. Dominant follicle
without mass

Left ovary

Measurements: 3.2 x 2.1 x 2.0 cm = volume: 7.0 mL. Dominant follicle
without mass

Other findings

No free pelvic fluid or adnexal masses.
IMPRESSION: IUD identified in expected position at the upper uterine segment
endometrial canal.

Small amount of nonspecific endocervical fluid.

Otherwise normal exam.

## 2021-04-23 ENCOUNTER — Ambulatory Visit: Payer: Self-pay

## 2021-06-26 ENCOUNTER — Encounter (HOSPITAL_BASED_OUTPATIENT_CLINIC_OR_DEPARTMENT_OTHER): Payer: Self-pay | Admitting: Emergency Medicine

## 2021-06-26 ENCOUNTER — Emergency Department (HOSPITAL_BASED_OUTPATIENT_CLINIC_OR_DEPARTMENT_OTHER)
Admission: EM | Admit: 2021-06-26 | Discharge: 2021-06-26 | Disposition: A | Discharge status: Home / Self Care | Payer: Medicaid Other | Attending: Emergency Medicine | Admitting: Emergency Medicine

## 2021-06-26 ENCOUNTER — Other Ambulatory Visit: Payer: Self-pay

## 2021-06-26 DIAGNOSIS — Z91010 Allergy to peanuts: Secondary | ICD-10-CM | POA: Insufficient documentation

## 2021-06-26 DIAGNOSIS — G44209 Tension-type headache, unspecified, not intractable: Secondary | ICD-10-CM

## 2021-06-26 DIAGNOSIS — B349 Viral infection, unspecified: Secondary | ICD-10-CM | POA: Insufficient documentation

## 2021-06-26 DIAGNOSIS — Z20822 Contact with and (suspected) exposure to covid-19: Secondary | ICD-10-CM | POA: Insufficient documentation

## 2021-06-26 LAB — RESP PANEL BY RT-PCR (FLU A&B, COVID) ARPGX2
Influenza A by PCR: NEGATIVE
Influenza B by PCR: NEGATIVE
SARS Coronavirus 2 by RT PCR: NEGATIVE

## 2021-06-26 LAB — GROUP A STREP BY PCR: Group A Strep by PCR: NOT DETECTED

## 2021-06-26 MED ORDER — KETOROLAC TROMETHAMINE 15 MG/ML IJ SOLN
15.0000 mg | Freq: Once | INTRAMUSCULAR | Status: AC
  Administered 2021-06-26: 15 mg via INTRAVENOUS
  Filled 2021-06-26: qty 1

## 2021-06-26 MED ORDER — DEXAMETHASONE SODIUM PHOSPHATE 10 MG/ML IJ SOLN
10.0000 mg | Freq: Once | INTRAMUSCULAR | Status: AC
  Administered 2021-06-26: 10 mg via INTRAVENOUS
  Filled 2021-06-26: qty 1

## 2021-06-26 MED ORDER — LACTATED RINGERS IV BOLUS
500.0000 mL | Freq: Once | INTRAVENOUS | Status: AC
  Administered 2021-06-26: 500 mL via INTRAVENOUS

## 2021-06-26 NOTE — ED Triage Notes (Signed)
Pt arrives to ED with c/o headache, sore throat, and ear pain x4 days.

## 2021-06-26 NOTE — ED Notes (Addendum)
Provider requested rate change to 934ml/hr.

## 2021-06-26 NOTE — Discharge Instructions (Signed)
Your headache was likely due to a viral infection that is causing congestion.  I am glad that you are feeling much better.  You can take Tylenol Motrin at home.  I would also recommend taking Mucinex to help with some of your congestion.  I hope you feel better soon

## 2021-06-26 NOTE — ED Provider Notes (Signed)
[facility] EMERGENCY DEPT Provider Note   CSN: [id number] Arrival date & time: 06/26/21  1750     History  Chief Complaint  Patient presents with   Headache   Sore Throat   Otalgia    Brandi Madden is a 26 y.o. female who presents to the ED for evaluation of headache, sore throat and ear pain bilaterally x4 days.  Patient states her headache is causing her the biggest problem and she has tried Tylenol and Motrin and nothing seems to resolve her pain.  No recent sick contacts.  She denies fever, chills, abdominal pain, nausea, vomiting, diarrhea, cough and congestion.  She denies vision changes, numbness and tingling.   Headache Associated symptoms: ear pain   Sore Throat Associated symptoms include headaches.  Otalgia Associated symptoms: headaches        Home Medications Prior to Admission medications   Medication Sig Start Date End Date Taking? Authorizing Provider  cetirizine (ZYRTEC ALLERGY) 10 MG tablet Take 1 tablet (10 mg total) by mouth daily. 11/22/19   Avegno, Zachery Dakins, FNP  cetirizine-pseudoephedrine (ZYRTEC-D) 5-120 MG tablet Take 1 tablet by mouth daily. 09/18/18   Wurst, [place], PA-C  EPINEPHrine 0.3 mg/0.3 mL IJ SOAJ injection INJECT 1 SYRINGE UTD PRF SYSTEMIC REACTION 09/15/15   [provider]  erythromycin ophthalmic ointment Place a 1/2 inch ribbon of ointment into the right lower eyelid BID. 10/15/20   Particia Nearing, PA-C  etonogestrel (NEXPLANON) 68 MG IMPL implant 1 each by Subdermal route once.    [provider]  fluticasone (FLONASE) 50 MCG/ACT nasal spray Place 1 spray into both nostrils daily for 14 days. 11/22/19 12/06/19  Avegno, Zachery Dakins, FNP  loratadine-pseudoephedrine (CLARITIN-D 12 HOUR) 5-120 MG per tablet Take 1 tablet by mouth 2 (two) times daily. 06/05/13   Ivery Quale, PA-C  neomycin-polymyxin-hydrocortisone (CORTISPORIN) 3.5-10000-1 OTIC suspension Place 4 drops into the right ear 3  (three) times daily. 11/22/19   Avegno, Zachery Dakins, FNP  promethazine-dextromethorphan (PROMETHAZINE-DM) 6.25-15 MG/5ML syrup Take 5 mLs by mouth 4 (four) times daily as needed for cough. 12/06/20   Mardella Layman, MD      Allergies    Peanut-containing drug products and Other    Review of Systems   Review of Systems  HENT:  Positive for ear pain.   Neurological:  Positive for headaches.    Physical Exam Updated Vital Signs BP 135/80 (BP Location: Right Arm)   Pulse 68   Temp 98.6 F (37 C) (Temporal)   Resp 16   Ht 5\' 6"  (1.676 m)   Wt 122.5 kg   SpO2 99%   BMI 43.58 kg/m  Physical Exam Vitals and nursing note reviewed.  Constitutional:      General: She is not in acute distress.    Appearance: She is not ill-appearing.  HENT:     Head: Atraumatic.     Right Ear: A middle ear effusion is present.     Left Ear: A middle ear effusion is present.     Mouth/Throat:     Tonsils: No tonsillar exudate. 2+ on the right. 2+ on the left.  Eyes:     Conjunctiva/sclera: Conjunctivae normal.  Cardiovascular:     Rate and Rhythm: Normal rate and regular rhythm.     Pulses: Normal pulses.     Heart sounds: No murmur heard. Pulmonary:     Effort: Pulmonary effort is normal. No respiratory distress.     Breath sounds: Normal breath sounds.  Abdominal:     General: Abdomen is flat. There is no distension.     Palpations: Abdomen is soft.     Tenderness: There is no abdominal tenderness.  Musculoskeletal:        General: Normal range of motion.     Cervical back: Normal range of motion.  Skin:    General: Skin is warm and dry.     Capillary Refill: Capillary refill takes less than 2 seconds.  Neurological:     General: No focal deficit present.     Mental Status: She is alert.  Psychiatric:        Mood and Affect: Mood normal.     ED Results / Procedures / Treatments   Labs (all labs ordered are listed, but only abnormal results are displayed) Labs Reviewed  GROUP A  STREP BY PCR  RESP PANEL BY RT-PCR (FLU A&B, COVID) [id number]    EKG None  Radiology No results found.  Procedures Procedures    Medications Ordered in ED Medications  lactated ringers bolus 500 mL (500 mLs Intravenous New Bag/Given 06/26/21 2135)  dexamethasone (DECADRON) injection 10 mg (10 mg Intravenous Given 06/26/21 2136)  ketorolac (TORADOL) 15 MG/ML injection 15 mg (15 mg Intravenous Given 06/26/21 2136)    ED Course/ Medical Decision Making/ A&P                           Medical Decision Making Risk Prescription drug management.   Social determinants of health:  Social History   Socioeconomic History   Marital status: Single    Spouse name: Not on file   Number of children: Not on file   Years of education: Not on file   Highest education level: Not on file  Occupational History   Not on file  Tobacco Use   Smoking status: Never   Smokeless tobacco: Never  Vaping Use   Vaping Use: Never used  Substance and Sexual Activity   Alcohol use: Yes    Comment: occasionally   Drug use: No   Sexual activity: Not Currently    Birth control/protection: Implant  Other Topics Concern   Not on file  Social History Narrative   Live in [place], [place].   Has a younger sister, 38 y/o.   Married.    Eats all food groups.   Has a car at school.   No tickets. Wear seatbelt.       [facility].    Social Determinants of Health   Financial Resource Strain: Not on file  Food Insecurity: Not on file  Transportation Needs: Not on file  Physical Activity: Insufficiently Active (09/18/2018)   Exercise Vital Sign    Days of Exercise per Week: 3 days    Minutes of Exercise per Session: 40 min  Stress: No Stress Concern Present (09/18/2018)   [employer] of Occupational Health - Occupational Stress Questionnaire    Feeling of Stress : Not at all  Social Connections: Moderately Integrated (09/18/2018)   Social Connection and Isolation Panel [NHANES]     Frequency of Communication with Friends and Family: More than three times a week    Frequency of Social Gatherings with Friends and Family: More than three times a week    Attends Religious Services: More than 4 times per year    Active Member of [employer] or Organizations: Yes    Attends Banker Meetings: More than 4 times per year  Marital Status: Never married  Intimate Partner Violence: Not At Risk (09/18/2018)   Humiliation, Afraid, Rape, and Kick questionnaire    Fear of Current or Ex-Partner: No    Emotionally Abused: No    Physically Abused: No    Sexually Abused: No     Initial impression:  This patient presents to the ED for concern of headache and ear pain, this involves an extensive number of treatment options, and is a complaint that carries with it a high risk of complications and morbidity.   Differentials include acute otitis media, otitis externa, migraine, tension headache, viral uri, mastoiditis.   Comorbidities affecting care:  none  Additional history obtained: Nursing note reviewed  Lab Tests  I Ordered, reviewed, and interpreted labs and EKG.  The pertinent results include:  Negative COVID, flu and strep   Medicines ordered and prescription drug management:  I ordered medication including: LR bolus 500 mL Toradol 15 mg IV Decadron 10 mg IV Reevaluation of the patient after these medicines showed that the patient resolved I have reviewed the patients home medicines and have made adjustments as needed   ED Course/Re-evaluation: Presents the ED for evaluation of headache, bilateral ear pain.  Vitals without significant abnormality.  On exam her dependability's are clear although notable for middle ear effusion.  No signs of infection.  She has mild bilateral lateral tonsillar swelling without exudate.  Airway is intact.  She reports headache is the worst of her symptoms so she was given migraine cocktail and fluid bolus with resolution of  symptoms.  On reevaluation, she is feeling much better and would like to go home.  Advised Tylenol or Motrin if headache returns and Mucinex for her congestion.  Patient expresses understanding and is amenable to plan  Disposition:  After consideration of the diagnostic results, physical exam, history and the patients response to treatment feel that the patent would benefit from discharge.   Viral URI Headache: Plan and management as described above. Discharged home in good condition.  Final Clinical Impression(s) / ED Diagnoses Final diagnoses:  Viral infection  Acute non intractable tension-type headache    Rx / DC Orders ED Discharge Orders     None         Delight Ovens 06/26/21 2227    Tegeler, Canary Brim, MD 06/27/21 [phone]

## 2021-06-26 NOTE — ED Notes (Signed)
Pt discharged at 22:36. Delay in discharge from system, Registration was in the chart.

## 2021-08-22 ENCOUNTER — Encounter: Payer: Self-pay | Admitting: Emergency Medicine

## 2021-08-22 ENCOUNTER — Ambulatory Visit
Admission: EM | Admit: 2021-08-22 | Discharge: 2021-08-22 | Disposition: A | Discharge status: Home / Self Care | Payer: Medicaid Other | Attending: Nurse Practitioner | Admitting: Nurse Practitioner

## 2021-08-22 DIAGNOSIS — H00011 Hordeolum externum right upper eyelid: Secondary | ICD-10-CM

## 2021-08-22 MED ORDER — ERYTHROMYCIN 5 MG/GM OP OINT: TOPICAL_OINTMENT | Freq: Every day | OPHTHALMIC | 0 refills | Status: AC

## 2021-08-22 NOTE — ED Provider Notes (Signed)
[facility]    CSN: [id number] Arrival date & time: 08/22/21  1408      History   Chief Complaint No chief complaint on file.   HPI Brandi Madden is a 26 y.o. female.   Patient presents with right upper eyelid swelling and redness for the past few days. Reports a history of styes on the same eye.  Denies eye redness, pain in her eye or foreign body sensation, and contact lens use.  Denies vision impairment, discharge, crusting/matting of her eyelids.  No headache or floaters in her vision.  Has tried warm compresses without relief.     Past Medical History:  Diagnosis Date   Allergy    Eczema    Encounter for education about contraceptive use 06/29/2015   Hyperlipemia    Hypertension    Nexplanon insertion 07/21/2015   Inserted left arm 07/21/15    Patient Active Problem List   Diagnosis Date Noted   Irregular intermenstrual bleeding 10/13/2015   Nexplanon in place 10/13/2015   Nexplanon insertion 07/21/2015   Encounter for education about contraceptive use 06/29/2015    Past Surgical History:  Procedure Laterality Date   WISDOM TOOTH EXTRACTION      OB History     Gravida  0   Para  0   Term  0   Preterm  0   AB  0   Living  0      SAB  0   IAB  0   Ectopic  0   Multiple  0   Live Births               Home Medications    Prior to Admission medications   Medication Sig Start Date End Date Taking? Authorizing Provider  cetirizine (ZYRTEC ALLERGY) 10 MG tablet Take 1 tablet (10 mg total) by mouth daily. 11/22/19   Avegno, Zachery Dakins, FNP  EPINEPHrine 0.3 mg/0.3 mL IJ SOAJ injection INJECT 1 SYRINGE UTD PRF SYSTEMIC REACTION 09/15/15   [provider]  erythromycin ophthalmic ointment Place into the right eye at bedtime. Place a 1/2 inch ribbon of ointment into the right lower eyelid 08/22/21   Valentino Nose, NP  etonogestrel (NEXPLANON) 68 MG IMPL implant 1 each by Subdermal route once.    [provider]  fluticasone (FLONASE) 50 MCG/ACT nasal spray Place 1 spray into both nostrils daily for 14 days. 11/22/19 12/06/19  Avegno, Zachery Dakins, FNP  loratadine-pseudoephedrine (CLARITIN-D 12 HOUR) 5-120 MG per tablet Take 1 tablet by mouth 2 (two) times daily. 06/05/13   Ivery Quale, PA-C  neomycin-polymyxin-hydrocortisone (CORTISPORIN) 3.5-10000-1 OTIC suspension Place 4 drops into the right ear 3 (three) times daily. 11/22/19   Avegno, Zachery Dakins, FNP  promethazine-dextromethorphan (PROMETHAZINE-DM) 6.25-15 MG/5ML syrup Take 5 mLs by mouth 4 (four) times daily as needed for cough. 12/06/20   Mardella Layman, MD    Family History Family History  Problem Relation Age of Onset   Hypertension Mother    Hyperlipidemia Mother    Stroke Maternal Grandmother    Hypertension Maternal Grandfather    Diabetes Paternal Grandmother     Social History Social History   Tobacco Use   Smoking status: Never   Smokeless tobacco: Never  Vaping Use   Vaping Use: Never used  Substance Use Topics   Alcohol use: Yes    Comment: occasionally   Drug use: No     Allergies   Peanut-containing drug products and Other  Review of Systems Review of Systems Per HPI  Physical Exam Triage Vital Signs ED Triage Vitals  Enc Vitals Group     BP 08/22/21 1415 (!) 141/100     Pulse Rate 08/22/21 1415 83     Resp 08/22/21 1415 18     Temp 08/22/21 1415 98.5 F (36.9 C)     Temp Source 08/22/21 1415 Oral     SpO2 08/22/21 1415 98 %     Weight --      Height --      Head Circumference --      Peak Flow --      Pain Score 08/22/21 1416 6     Pain Loc --      Pain Edu? --      Excl. in GC? --    No data found.  Updated Vital Signs BP (!) 141/100 (BP Location: Right Arm)   Pulse 83   Temp 98.5 F (36.9 C) (Oral)   Resp 18   LMP 08/03/2021 (Exact Date)   SpO2 98%   Visual Acuity Right Eye Distance:   Left Eye Distance:   Bilateral Distance:    Right Eye Near:   Left Eye Near:     Bilateral Near:     Physical Exam Vitals and nursing note reviewed.  Constitutional:      General: She is not in acute distress.    Appearance: Normal appearance. She is not toxic-appearing.  HENT:     Head: Normocephalic and atraumatic.  Eyes:     General: No scleral icterus.       Right eye: Hordeolum present. No discharge.        Left eye: No discharge.     Extraocular Movements: Extraocular movements intact.     Right eye: Normal extraocular motion.     Left eye: Normal extraocular motion.     Conjunctiva/sclera:     Right eye: Right conjunctiva is not injected. No exudate.    Left eye: Left conjunctiva is not injected. No exudate.    Pupils: Pupils are equal, round, and reactive to light.      Comments: Hordeolum to the area marked; no fluctuance.  There does appear to be active drainage from the hordeolum.  Pulmonary:     Effort: Pulmonary effort is normal. No respiratory distress.  Skin:    General: Skin is warm and dry.     Capillary Refill: Capillary refill takes less than 2 seconds.     Coloration: Skin is not jaundiced or pale.     Findings: No erythema.  Neurological:     Mental Status: She is alert and oriented to person, place, and time.  Psychiatric:        Behavior: Behavior is cooperative.      UC Treatments / Results  Labs (all labs ordered are listed, but only abnormal results are displayed) Labs Reviewed - No data to display  EKG   Radiology No results found.  Procedures Procedures (including critical care time)  Medications Ordered in UC Medications - No data to display  Initial Impression / Assessment and Plan / UC Course  I have reviewed the triage vital signs and the nursing notes.  Pertinent labs & imaging results that were available during my care of the patient were reviewed by me and considered in my medical decision making (see chart for details).    Patient is a very pleasant, well-appearing 26 year old female presenting  for right upper eyelid swelling for the  past couple of days.  Hordeolum is present on exam-we discussed supportive care.  It appears that the hordeolum is actively draining and explained this to the patient this will likely self resolve over the next 24 to 48 hours.  We will give a prescription for erythromycin ointment should this not occur or if the symptoms worsen.  The patient was given the opportunity to ask questions.  All questions answered to their satisfaction.  The patient is in agreement to this plan.   Final Clinical Impressions(s) / UC Diagnoses   Final diagnoses:  Hordeolum externum of right upper eyelid     Discharge Instructions      - It looks like the stye is draining and mostly healed up -For irritation, you can use erythromycin ointment -Otherwise, continue the warm compresses and try to change your mascara every 3 to 6 months to help prevent stye     ED Prescriptions     Medication Sig Dispense Auth. Provider   erythromycin ophthalmic ointment Place into the right eye at bedtime. Place a 1/2 inch ribbon of ointment into the right lower eyelid 3.5 g Valentino Nose, NP      PDMP not reviewed this encounter.   Valentino Nose, NP 08/22/21 1504

## 2021-08-22 NOTE — Discharge Instructions (Addendum)
-   It looks like the stye is draining and mostly healed up -For irritation, you can use erythromycin ointment -Otherwise, continue the warm compresses and try to change your mascara every 3 to 6 months to help prevent stye

## 2021-08-22 NOTE — ED Triage Notes (Signed)
Right upper eye lid swollen x 2 to 3 days.  Has been applying warm compresses without relief.

## 2021-09-12 ENCOUNTER — Telehealth: Payer: Self-pay | Admitting: Emergency Medicine

## 2021-09-12 NOTE — Telephone Encounter (Signed)
Pt reports was seen on 8/16 for sty and never picked up abx px. Pt reports pharmacy no longer has medication and would like prescription resent. Discussed with pt would need to be re-seen due to time between first evaluation and today. pt states symptoms improved but returned recently and verbalized understanding of need for re-evaluation.

## 2022-01-06 ENCOUNTER — Other Ambulatory Visit: Payer: Self-pay

## 2022-01-06 ENCOUNTER — Encounter (HOSPITAL_BASED_OUTPATIENT_CLINIC_OR_DEPARTMENT_OTHER): Payer: Self-pay

## 2022-01-06 ENCOUNTER — Emergency Department (HOSPITAL_BASED_OUTPATIENT_CLINIC_OR_DEPARTMENT_OTHER)
Admission: EM | Admit: 2022-01-06 | Discharge: 2022-01-06 | Disposition: A | Discharge status: Home / Self Care | Payer: Managed Care, Other (non HMO) | Attending: Emergency Medicine | Admitting: Emergency Medicine

## 2022-01-06 DIAGNOSIS — J039 Acute tonsillitis, unspecified: Secondary | ICD-10-CM | POA: Insufficient documentation

## 2022-01-06 DIAGNOSIS — Z1152 Encounter for screening for COVID-19: Secondary | ICD-10-CM | POA: Diagnosis not present

## 2022-01-06 DIAGNOSIS — Z91010 Allergy to peanuts: Secondary | ICD-10-CM | POA: Insufficient documentation

## 2022-01-06 DIAGNOSIS — J069 Acute upper respiratory infection, unspecified: Secondary | ICD-10-CM | POA: Diagnosis not present

## 2022-01-06 DIAGNOSIS — R509 Fever, unspecified: Secondary | ICD-10-CM | POA: Diagnosis present

## 2022-01-06 LAB — RESP PANEL BY RT-PCR (RSV, FLU A&B, COVID)  RVPGX2
Influenza A by PCR: NEGATIVE
Influenza B by PCR: NEGATIVE
Resp Syncytial Virus by PCR: NEGATIVE
SARS Coronavirus 2 by RT PCR: NEGATIVE

## 2022-01-06 LAB — GROUP A STREP BY PCR: Group A Strep by PCR: NOT DETECTED

## 2022-01-06 MED ORDER — AMOXICILLIN-POT CLAVULANATE 875-125 MG PO TABS: 1.0000 | ORAL_TABLET | Freq: Two times a day (BID) | ORAL | 0 refills | Status: AC

## 2022-01-06 NOTE — ED Notes (Signed)
Patient verbalizes understanding of discharge instructions. Opportunity for questioning and answers were provided. Patient discharged from ED.  °

## 2022-01-06 NOTE — ED Triage Notes (Signed)
POV from home, c/o sore throat, fever, chills, left ear pain and cough since a couple days ago. Denies NVD. Amb, A&O x 4, NAD.

## 2022-01-06 NOTE — ED Provider Notes (Signed)
[facility] EMERGENCY DEPT Provider Note   CSN: [id number] Arrival date & time: 01/06/22  0602     History {Add pertinent medical, surgical, social history, OB history to HPI:1} Chief Complaint  Patient presents with   Fever    Brandi Madden is a 26 y.o. female.  HPI     Chills, body aches, fever at home 99.3 but took ibuprofen prior to arrival, sore throat, ear pain left ear more, worse with swallowing Sore throat is worst part, thought tonsils looked swollen.  No drooling, shortness of breath. Doed have hoarse voice, congestion. Not much cough.  Abdominal pain, lower abdomen. No dysuria, vaginal discharge, diarrhea. Does have some constipation. No nausea or vomiting.  Have been around a lot of people who are sick, covid, flu, rsv   Home Medications Prior to Admission medications   Medication Sig Start Date End Date Taking? Authorizing Provider  cetirizine (ZYRTEC ALLERGY) 10 MG tablet Take 1 tablet (10 mg total) by mouth daily. 11/22/19   Avegno, Zachery Dakins, FNP  EPINEPHrine 0.3 mg/0.3 mL IJ SOAJ injection INJECT 1 SYRINGE UTD PRF SYSTEMIC REACTION 09/15/15   [provider]  erythromycin ophthalmic ointment Place into the right eye at bedtime. Place a 1/2 inch ribbon of ointment into the right lower eyelid 08/22/21   Valentino Nose, NP  etonogestrel (NEXPLANON) 68 MG IMPL implant 1 each by Subdermal route once.    [provider]  fluticasone (FLONASE) 50 MCG/ACT nasal spray Place 1 spray into both nostrils daily for 14 days. 11/22/19 12/06/19  Avegno, Zachery Dakins, FNP  loratadine-pseudoephedrine (CLARITIN-D 12 HOUR) 5-120 MG per tablet Take 1 tablet by mouth 2 (two) times daily. 06/05/13   Ivery Quale, PA-C  neomycin-polymyxin-hydrocortisone (CORTISPORIN) 3.5-10000-1 OTIC suspension Place 4 drops into the right ear 3 (three) times daily. 11/22/19   Avegno, Zachery Dakins, FNP  promethazine-dextromethorphan (PROMETHAZINE-DM) 6.25-15 MG/5ML  syrup Take 5 mLs by mouth 4 (four) times daily as needed for cough. 12/06/20   Mardella Layman, MD      Allergies    Peanut-containing drug products and Other    Review of Systems   Review of Systems  Physical Exam Updated Vital Signs BP (!) 160/104   Pulse 74   Temp 98.6 F (37 C) (Oral)   Resp 18   Ht 5\' 5"  (1.651 m)   Wt (!) 154.2 kg   LMP 12/01/2021   SpO2 100%   BMI 56.58 kg/m  Physical Exam Vitals and nursing note reviewed.  Constitutional:      General: She is not in acute distress.    Appearance: She is well-developed. She is not diaphoretic.  HENT:     Head: Normocephalic and atraumatic.  Eyes:     Conjunctiva/sclera: Conjunctivae normal.  Cardiovascular:     Rate and Rhythm: Normal rate and regular rhythm.     Heart sounds: Normal heart sounds. No murmur heard.    No friction rub. No gallop.  Pulmonary:     Effort: Pulmonary effort is normal. No respiratory distress.     Breath sounds: Normal breath sounds. No wheezing or rales.  Abdominal:     General: There is no distension.     Palpations: Abdomen is soft.     Tenderness: There is no abdominal tenderness. There is no guarding.  Musculoskeletal:        General: No tenderness.     Cervical back: Normal range of motion.  Skin:    General: Skin is warm and  dry.     Findings: No erythema or rash.  Neurological:     Mental Status: She is alert and oriented to person, place, and time.     ED Results / Procedures / Treatments   Labs (all labs ordered are listed, but only abnormal results are displayed) Labs Reviewed  GROUP A STREP BY PCR  RESP PANEL BY RT-PCR (RSV, FLU A&B, COVID)  [id number]    EKG None  Radiology No results found.  Procedures Procedures  {Document cardiac monitor, telemetry assessment procedure when appropriate:1}  Medications Ordered in ED Medications - No data to display  ED Course/ Medical Decision Making/ A&P                           Medical Decision  Making  ***  {Document critical care time when appropriate:1} {Document review of labs and clinical decision tools ie heart score, Chads2Vasc2 etc:1}  {Document your independent review of radiology images, and any outside records:1} {Document your discussion with family members, caretakers, and with consultants:1} {Document social determinants of health affecting pt's care:1} {Document your decision making why or why not admission, treatments were needed:1} Final Clinical Impression(s) / ED Diagnoses Final diagnoses:  None    Rx / DC Orders ED Discharge Orders     None

## 2022-08-23 ENCOUNTER — Emergency Department (HOSPITAL_BASED_OUTPATIENT_CLINIC_OR_DEPARTMENT_OTHER)
Admission: EM | Admit: 2022-08-23 | Discharge: 2022-08-23 | Disposition: A | Discharge status: Home / Self Care | Payer: Managed Care, Other (non HMO) | Attending: Emergency Medicine | Admitting: Emergency Medicine

## 2022-08-23 ENCOUNTER — Encounter (HOSPITAL_BASED_OUTPATIENT_CLINIC_OR_DEPARTMENT_OTHER): Payer: Self-pay | Admitting: Emergency Medicine

## 2022-08-23 DIAGNOSIS — S4991XA Unspecified injury of right shoulder and upper arm, initial encounter: Secondary | ICD-10-CM | POA: Diagnosis present

## 2022-08-23 DIAGNOSIS — Z91010 Allergy to peanuts: Secondary | ICD-10-CM | POA: Diagnosis not present

## 2022-08-23 DIAGNOSIS — Y92410 Unspecified street and highway as the place of occurrence of the external cause: Secondary | ICD-10-CM | POA: Diagnosis not present

## 2022-08-23 DIAGNOSIS — S46911A Strain of unspecified muscle, fascia and tendon at shoulder and upper arm level, right arm, initial encounter: Secondary | ICD-10-CM | POA: Diagnosis not present

## 2022-08-23 MED ORDER — CYCLOBENZAPRINE HCL 10 MG PO TABS
10.0000 mg | ORAL_TABLET | Freq: Once | ORAL | Status: AC
  Administered 2022-08-23: 10 mg via ORAL
  Filled 2022-08-23: qty 1

## 2022-08-23 MED ORDER — CYCLOBENZAPRINE HCL 10 MG PO TABS: 10.0000 mg | ORAL_TABLET | Freq: Two times a day (BID) | ORAL | 0 refills | Status: AC | PRN

## 2022-08-23 NOTE — ED Notes (Signed)
Reviewed AVS/discharge instruction with patient. Time allotted for and all questions answered. Patient is agreeable for d/c and escorted to ed exit by staff.  

## 2022-08-23 NOTE — Discharge Instructions (Signed)
In addition to the muscle relaxer which may make you drowsy so you may only want to take it at night you can take Tylenol and ibuprofen as needed, muscle rubs and heating pad may also be helpful
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
## 2022-08-26 ENCOUNTER — Ambulatory Visit: Payer: Self-pay

## 2023-01-16 ENCOUNTER — Ambulatory Visit
Admission: RE | Admit: 2023-01-16 | Discharge: 2023-01-16 | Disposition: A | Discharge status: Home / Self Care | Payer: Self-pay | Source: Ambulatory Visit | Attending: Nurse Practitioner

## 2023-01-16 VITALS — BP 146/99 | HR 87 | Temp 98.2°F | Resp 18

## 2023-01-16 DIAGNOSIS — J029 Acute pharyngitis, unspecified: Secondary | ICD-10-CM | POA: Insufficient documentation

## 2023-01-16 DIAGNOSIS — B349 Viral infection, unspecified: Secondary | ICD-10-CM | POA: Insufficient documentation

## 2023-01-16 DIAGNOSIS — J069 Acute upper respiratory infection, unspecified: Secondary | ICD-10-CM | POA: Insufficient documentation

## 2023-01-16 LAB — POC COVID19/FLU A&B COMBO
Covid Antigen, POC: NEGATIVE
Influenza A Antigen, POC: NEGATIVE
Influenza B Antigen, POC: NEGATIVE

## 2023-01-16 LAB — POCT RAPID STREP A (OFFICE): Rapid Strep A Screen: NEGATIVE

## 2023-01-16 MED ORDER — FLUTICASONE PROPIONATE 50 MCG/ACT NA SUSP: 2.0000 | Freq: Every day | NASAL | 0 refills | Status: DC

## 2023-01-16 MED ORDER — LIDOCAINE VISCOUS HCL 2 % MT SOLN: OROMUCOSAL | 0 refills | Status: DC

## 2023-01-16 MED ORDER — LIDOCAINE VISCOUS HCL 2 % MT SOLN: OROMUCOSAL | 0 refills | Status: AC

## 2023-01-16 MED ORDER — CETIRIZINE HCL 10 MG PO TABS: 10.0000 mg | ORAL_TABLET | Freq: Every day | ORAL | 0 refills | Status: AC

## 2023-01-16 NOTE — ED Triage Notes (Signed)
 Pt reports she has body aches, ear pain, throat pain, and headache x 2 days

## 2023-01-16 NOTE — Discharge Instructions (Addendum)
 The rapid strep test and COVID/flu test were negative.  A throat culture is pending.  You will be contacted if the pending test result is abnormal. Take medication as prescribed. Increase fluids and allow for plenty of rest. May take over-the-counter Tylenol or ibuprofen  as needed for pain, fever, or general discomfort. Warm salt water gargles 3-4 times daily as needed for throat pain or discomfort. Also recommend using Chloraseptic throat spray and throat lozenges for throat pain or discomfort. Recommend a soft diet to include soup, broth, yogurt, pudding, Jell-O, or popsicles while symptoms persist. Recommend normal saline nasal spray throughout the day for nasal congestion and runny nose. If symptoms do not improve over the next several days, or appear to be worsening, you may follow-up in this clinic or with your primary care physician for further evaluation. Follow-up as needed.

## 2023-01-16 NOTE — ED Provider Notes (Signed)
 [facility]    CSN: [id number] Arrival date & time: 01/16/23  1746      History   Chief Complaint Chief Complaint  Patient presents with   Fever    Entered by patient    HPI Brandi Madden is a 28 y.o. female.   The history is provided by the patient.   Patient presents for complaints of subjective fever, chills, body aches, ear pain, headache, and sore throat.  Symptoms started over the past 2 days.  Denies runny nose, cough, chest pain, abdominal pain, nausea, vomiting, diarrhea, or rash.  Patient states that she took over-the-counter medication for her symptoms with minimal relief.  She endorses sick contacts.  Past Medical History:  Diagnosis Date   Allergy    Eczema    Encounter for education about contraceptive use 06/29/2015   Hyperlipemia    Hypertension    Nexplanon insertion 07/21/2015   Inserted left arm 07/21/15    Patient Active Problem List   Diagnosis Date Noted   Irregular intermenstrual bleeding 10/13/2015   Nexplanon in place 10/13/2015   Nexplanon insertion 07/21/2015   Encounter for education about contraceptive use 06/29/2015    Past Surgical History:  Procedure Laterality Date   WISDOM TOOTH EXTRACTION      OB History     Gravida  0   Para  0   Term  0   Preterm  0   AB  0   Living  0      SAB  0   IAB  0   Ectopic  0   Multiple  0   Live Births               Home Medications    Prior to Admission medications   Medication Sig Start Date End Date Taking? Authorizing Provider  cetirizine  (ZYRTEC ) 10 MG tablet Take 1 tablet (10 mg total) by mouth daily. 01/16/23  Yes Leath-Warren, Etta PARAS, NP  fluticasone  (FLONASE ) 50 MCG/ACT nasal spray Place 2 sprays into both nostrils daily. 01/16/23  Yes Leath-Warren, Etta PARAS, NP  cyclobenzaprine  (FLEXERIL ) 10 MG tablet Take 1 tablet (10 mg total) by mouth 2 (two) times daily as needed for muscle spasms. 08/23/22   Doretha Folks, MD  EPINEPHrine 0.3 mg/0.3  mL IJ SOAJ injection INJECT 1 SYRINGE UTD PRF SYSTEMIC REACTION 09/15/15   [provider]  erythromycin  ophthalmic ointment Place into the right eye at bedtime. Place a 1/2 inch ribbon of ointment into the right lower eyelid 08/22/21   Chandra Harlene LABOR, NP  etonogestrel (NEXPLANON) 68 MG IMPL implant 1 each by Subdermal route once.    [provider]  lidocaine  (XYLOCAINE ) 2 % solution Gargle and spit 5 mL every 6 hours as needed for throat pain or discomfort. 01/16/23   Leath-Warren, Etta PARAS, NP  loratadine -pseudoephedrine  (CLARITIN -D 12 HOUR) 5-120 MG per tablet Take 1 tablet by mouth 2 (two) times daily. 06/05/13   Armida Culver, PA-C  neomycin -polymyxin-hydrocortisone (CORTISPORIN) 3.5-10000-1 OTIC suspension Place 4 drops into the right ear 3 (three) times daily. 11/22/19   Avegno, Komlanvi S, FNP  promethazine -dextromethorphan (PROMETHAZINE -DM) 6.25-15 MG/5ML syrup Take 5 mLs by mouth 4 (four) times daily as needed for cough. 12/06/20   Rolinda Rogue, MD    Family History Family History  Problem Relation Age of Onset   Hypertension Mother    Hyperlipidemia Mother    Stroke Maternal Grandmother    Hypertension Maternal Grandfather    Diabetes Paternal Grandmother  Social History Social History   Tobacco Use   Smoking status: Never   Smokeless tobacco: Never  Vaping Use   Vaping status: Never Used  Substance Use Topics   Alcohol use: Yes    Comment: occasionally   Drug use: No     Allergies   Peanut-containing drug products and Other   Review of Systems Review of Systems Per HPI  Physical Exam Triage Vital Signs ED Triage Vitals  Encounter Vitals Group     BP 01/16/23 1754 (!) 146/99     Systolic BP Percentile --      Diastolic BP Percentile --      Pulse Rate 01/16/23 1754 87     Resp 01/16/23 1754 18     Temp 01/16/23 1754 98.2 F (36.8 C)     Temp Source 01/16/23 1754 Oral     SpO2 01/16/23 1754 98 %     Weight --      Height --       Head Circumference --      Peak Flow --      Pain Score 01/16/23 1756 6     Pain Loc --      Pain Education --      Exclude from Growth Chart --    No data found.  Updated Vital Signs BP (!) 146/99 (BP Location: Right Arm)   Pulse 87   Temp 98.2 F (36.8 C) (Oral)   Resp 18   LMP 01/08/2023   SpO2 98%   Visual Acuity Right Eye Distance:   Left Eye Distance:   Bilateral Distance:    Right Eye Near:   Left Eye Near:    Bilateral Near:     Physical Exam Vitals and nursing note reviewed.  Constitutional:      General: She is not in acute distress.    Appearance: Normal appearance.  HENT:     Head: Normocephalic.     Right Ear: Tympanic membrane, ear canal and external ear normal.     Left Ear: Tympanic membrane, ear canal and external ear normal.     Nose: Congestion present.     Right Turbinates: Enlarged and swollen.     Left Turbinates: Enlarged and swollen.     Right Sinus: Maxillary sinus tenderness and frontal sinus tenderness present.     Left Sinus: Maxillary sinus tenderness and frontal sinus tenderness present.     Mouth/Throat:     Lips: Pink.     Mouth: Mucous membranes are moist.     Pharynx: Uvula midline. Pharyngeal swelling, posterior oropharyngeal erythema and postnasal drip present.     Tonsils: 1+ on the right. 1+ on the left.     Comments: Cobblestoning present to posterior oropharynx  Eyes:     Extraocular Movements: Extraocular movements intact.     Conjunctiva/sclera: Conjunctivae normal.     Pupils: Pupils are equal, round, and reactive to light.  Cardiovascular:     Rate and Rhythm: Regular rhythm.     Pulses: Normal pulses.     Heart sounds: Normal heart sounds.  Pulmonary:     Effort: Pulmonary effort is normal. No respiratory distress.     Breath sounds: Normal breath sounds. No stridor. No wheezing, rhonchi or rales.  Abdominal:     General: Bowel sounds are normal.     Palpations: Abdomen is soft.     Tenderness: There is no  abdominal tenderness.  Musculoskeletal:     Cervical back: Normal range of  motion.  Lymphadenopathy:     Cervical: No cervical adenopathy.  Skin:    General: Skin is warm and dry.  Neurological:     General: No focal deficit present.     Mental Status: She is alert and oriented to person, place, and time.  Psychiatric:        Mood and Affect: Mood normal.        Behavior: Behavior normal.      UC Treatments / Results  Labs (all labs ordered are listed, but only abnormal results are displayed) Labs Reviewed  POCT RAPID STREP A (OFFICE) - Normal  CULTURE, GROUP A STREP ([facility])  POC COVID19/FLU A&B COMBO    EKG   Radiology No results found.  Procedures Procedures (including critical care time)  Medications Ordered in UC Medications - No data to display  Initial Impression / Assessment and Plan / UC Course  I have reviewed the triage vital signs and the nursing notes.  Pertinent labs & imaging results that were available during my care of the patient were reviewed by me and considered in my medical decision making (see chart for details).  The rapid strep test and COVID/flu test are negative.  Throat culture is pending.  You will be contacted if the pending test result is abnormal.  Will treat for symptoms of viral etiology.  Viscous lidocaine  2% provided for patient to gargle and spit for throat pain or discomfort.  For the nasal congestion and sinus pressure, fluticasone  50 mcg nasal spray was prescribed.  Patient also prescribed cetirizine  10 mg to help with nasal congestion and is an antihistamine.  Supportive care recommendations were provided and discussed with the patient to include fluids, rest, over-the-counter analgesics, normal saline nasal spray, warm salt water gargles, and Chloraseptic throat spray for pain.  Discussed indications with the patient regarding follow-up.  Patient was in agreement with this plan of care and verbalizes understanding.  All questions were  answered.  Patient stable for discharge.  Work note was provided.  Final Clinical Impressions(s) / UC Diagnoses   Final diagnoses:  Viral upper respiratory infection  Sore throat  Viral illness     Discharge Instructions      The rapid strep test and COVID/flu test were negative.  A throat culture is pending.  You will be contacted if the pending test result is abnormal. Take medication as prescribed. Increase fluids and allow for plenty of rest. May take over-the-counter Tylenol or ibuprofen  as needed for pain, fever, or general discomfort. Warm salt water gargles 3-4 times daily as needed for throat pain or discomfort. Also recommend using Chloraseptic throat spray and throat lozenges for throat pain or discomfort. Recommend a soft diet to include soup, broth, yogurt, pudding, Jell-O, or popsicles while symptoms persist. Recommend normal saline nasal spray throughout the day for nasal congestion and runny nose. If symptoms do not improve over the next several days, or appear to be worsening, you may follow-up in this clinic or with your primary care physician for further evaluation. Follow-up as needed.     ED Prescriptions     Medication Sig Dispense Auth. Provider   cetirizine  (ZYRTEC ) 10 MG tablet Take 1 tablet (10 mg total) by mouth daily. 30 tablet Leath-Warren, Etta PARAS, NP   lidocaine  (XYLOCAINE ) 2 % solution  (Status: Discontinued) Gargle and spit 5 mL every 6 hours as needed for throat pain or discomfort. 100 mL Leath-Warren, Etta PARAS, NP   fluticasone  (FLONASE ) 50 MCG/ACT nasal spray Place  2 sprays into both nostrils daily. 16 g Leath-Warren, Etta PARAS, NP   lidocaine  (XYLOCAINE ) 2 % solution Gargle and spit 5 mL every 6 hours as needed for throat pain or discomfort. 100 mL Leath-Warren, Etta PARAS, NP      PDMP not reviewed this encounter.   Gilmer Etta PARAS, NP 01/16/23 [phone]

## 2023-01-18 LAB — CULTURE, GROUP A STREP (THRC)

## 2023-01-19 ENCOUNTER — Telehealth: Payer: Self-pay | Admitting: Emergency Medicine

## 2023-01-19 MED ORDER — AMOXICILLIN 875 MG PO TABS: 875.0000 mg | ORAL_TABLET | Freq: Two times a day (BID) | ORAL | 0 refills | Status: AC

## 2023-01-19 NOTE — Telephone Encounter (Signed)
 Pt presents to UC inquiring about work note extension and px related to recent mychart result. Reviewed chart, positive throat culture noted. Discussed with provider, given verbal order for Amoxil  875mg  PO BID x10 days if pt does not have penicillin  allergy. No penicillin  allergy noted. Px electronically sent in to [facility] [place]. Work note also extended to 01/20/2023.

## 2023-06-10 ENCOUNTER — Ambulatory Visit
Admission: EM | Admit: 2023-06-10 | Discharge: 2023-06-10 | Disposition: A | Discharge status: Home / Self Care | Attending: Nurse Practitioner | Admitting: Nurse Practitioner

## 2023-06-10 DIAGNOSIS — J069 Acute upper respiratory infection, unspecified: Secondary | ICD-10-CM | POA: Diagnosis present

## 2023-06-10 DIAGNOSIS — J029 Acute pharyngitis, unspecified: Secondary | ICD-10-CM

## 2023-06-10 LAB — POCT RAPID STREP A (OFFICE): Rapid Strep A Screen: NEGATIVE

## 2023-06-10 LAB — POC SARS CORONAVIRUS 2 AG -  ED: SARS Coronavirus 2 Ag: NEGATIVE

## 2023-06-10 MED ORDER — PROMETHAZINE-DM 6.25-15 MG/5ML PO SYRP: 5.0000 mL | ORAL_SOLUTION | Freq: Four times a day (QID) | ORAL | 0 refills | Status: AC | PRN

## 2023-06-10 MED ORDER — FLUTICASONE PROPIONATE 50 MCG/ACT NA SUSP: 2.0000 | Freq: Every day | NASAL | 0 refills | Status: AC

## 2023-06-10 NOTE — ED Triage Notes (Signed)
 Pt reports she has had diarrhea, nausea, bilateral ear pain, chest pain, and sore throat x 1 day    Took ibuprofen  but no relief

## 2023-06-10 NOTE — ED Provider Notes (Signed)
 [facility]    CSN: [id number] Arrival date & time: 06/10/23  0950      History   Chief Complaint No chief complaint on file.   HPI Brandi Madden is a 28 y.o. female.   The history is provided by the patient.   Patient presents with a 1 day history of headache, sore throat, nasal congestion, runny nose, bilateral ear pain, chest congestion, diarrhea, and cough.  Patient is unsure if she had a fever at home.  Further denies ear drainage, wheezing, difficulty breathing, abdominal pain, nausea or vomiting.  States that her diarrhea has improved since it started yesterday.  States she has been taking ibuprofen  for her symptoms.  Patient endorses close sick contact who had COVID.  Past Medical History:  Diagnosis Date   Allergy    Eczema    Encounter for education about contraceptive use 06/29/2015   Hyperlipemia    Hypertension    Nexplanon insertion 07/21/2015   Inserted left arm 07/21/15    Patient Active Problem List   Diagnosis Date Noted   Irregular intermenstrual bleeding 10/13/2015   Nexplanon in place 10/13/2015   Nexplanon insertion 07/21/2015   Encounter for education about contraceptive use 06/29/2015    Past Surgical History:  Procedure Laterality Date   WISDOM TOOTH EXTRACTION      OB History     Gravida  0   Para  0   Term  0   Preterm  0   AB  0   Living  0      SAB  0   IAB  0   Ectopic  0   Multiple  0   Live Births               Home Medications    Prior to Admission medications   Medication Sig Start Date End Date Taking? Authorizing Provider  fluticasone  (FLONASE ) 50 MCG/ACT nasal spray Place 2 sprays into both nostrils daily. 06/10/23  Yes Leath-Warren, Belen Bowers, NP  promethazine -dextromethorphan (PROMETHAZINE -DM) 6.25-15 MG/5ML syrup Take 5 mLs by mouth 4 (four) times daily as needed. 06/10/23  Yes Leath-Warren, Belen Bowers, NP  cetirizine  (ZYRTEC ) 10 MG tablet Take 1 tablet (10 mg total) by mouth daily.  01/16/23   Leath-Warren, Belen Bowers, NP  cyclobenzaprine  (FLEXERIL ) 10 MG tablet Take 1 tablet (10 mg total) by mouth 2 (two) times daily as needed for muscle spasms. 08/23/22   Almond Army, MD  EPINEPHrine 0.3 mg/0.3 mL IJ SOAJ injection INJECT 1 SYRINGE UTD PRF SYSTEMIC REACTION 09/15/15   [provider]  erythromycin  ophthalmic ointment Place into the right eye at bedtime. Place a 1/2 inch ribbon of ointment into the right lower eyelid 08/22/21   Wilhemena Harbour, NP  etonogestrel (NEXPLANON) 68 MG IMPL implant 1 each by Subdermal route once.    [provider]  lidocaine  (XYLOCAINE ) 2 % solution Gargle and spit 5 mL every 6 hours as needed for throat pain or discomfort. 01/16/23   Leath-Warren, Belen Bowers, NP  loratadine -pseudoephedrine  (CLARITIN -D 12 HOUR) 5-120 MG per tablet Take 1 tablet by mouth 2 (two) times daily. 06/05/13   Venson Ginger, PA-C  neomycin -polymyxin-hydrocortisone (CORTISPORIN) 3.5-10000-1 OTIC suspension Place 4 drops into the right ear 3 (three) times daily. 11/22/19   Avegno, Komlanvi S, FNP    Family History Family History  Problem Relation Age of Onset   Hypertension Mother    Hyperlipidemia Mother    Stroke Maternal Grandmother    Hypertension  Maternal Grandfather    Diabetes Paternal Grandmother     Social History Social History   Tobacco Use   Smoking status: Never   Smokeless tobacco: Never  Vaping Use   Vaping status: Never Used  Substance Use Topics   Alcohol use: Yes    Comment: occasionally   Drug use: No     Allergies   Peanut-containing drug products and Other   Review of Systems Review of Systems Per HPI  Physical Exam Triage Vital Signs ED Triage Vitals  Encounter Vitals Group     BP 06/10/23 1011 (!) 149/95     Systolic BP Percentile --      Diastolic BP Percentile --      Pulse Rate 06/10/23 1011 (!) 115     Resp 06/10/23 1011 18     Temp 06/10/23 1011 98.2 F (36.8 C)     Temp Source 06/10/23 1011  Oral     SpO2 06/10/23 1011 97 %     Weight --      Height --      Head Circumference --      Peak Flow --      Pain Score 06/10/23 1013 5     Pain Loc --      Pain Education --      Exclude from Growth Chart --    No data found.  Updated Vital Signs BP (!) 149/95 (BP Location: Right Wrist)   Pulse (!) 115   Temp 98.2 F (36.8 C) (Oral)   Resp 18   LMP 06/03/2023   SpO2 97%   Visual Acuity Right Eye Distance:   Left Eye Distance:   Bilateral Distance:    Right Eye Near:   Left Eye Near:    Bilateral Near:     Physical Exam Vitals and nursing note reviewed.  Constitutional:      General: She is not in acute distress.    Appearance: Normal appearance.  HENT:     Head: Normocephalic.     Right Ear: Tympanic membrane, ear canal and external ear normal.     Left Ear: Tympanic membrane, ear canal and external ear normal.     Nose: Congestion present.     Right Turbinates: Enlarged and swollen.     Left Turbinates: Enlarged and swollen.     Right Sinus: No maxillary sinus tenderness or frontal sinus tenderness.     Left Sinus: No maxillary sinus tenderness or frontal sinus tenderness.     Mouth/Throat:     Lips: Pink.     Mouth: Mucous membranes are moist.     Pharynx: Pharyngeal swelling, posterior oropharyngeal erythema and postnasal drip present. No oropharyngeal exudate or uvula swelling.     Tonsils: 1+ on the right. 1+ on the left.     Comments: Cobblestoning present to posterior oropharynx  Eyes:     Extraocular Movements: Extraocular movements intact.     Conjunctiva/sclera: Conjunctivae normal.     Pupils: Pupils are equal, round, and reactive to light.  Cardiovascular:     Rate and Rhythm: Normal rate and regular rhythm.     Pulses: Normal pulses.     Heart sounds: Normal heart sounds.  Pulmonary:     Effort: Pulmonary effort is normal. No respiratory distress.     Breath sounds: Normal breath sounds. No stridor. No wheezing, rhonchi or rales.   Abdominal:     General: Bowel sounds are normal.     Palpations: Abdomen is soft.  Tenderness: There is no abdominal tenderness.  Musculoskeletal:     Cervical back: Normal range of motion.  Lymphadenopathy:     Cervical: No cervical adenopathy.  Skin:    General: Skin is warm and dry.  Neurological:     General: No focal deficit present.     Mental Status: She is alert and oriented to person, place, and time.  Psychiatric:        Mood and Affect: Mood normal.        Behavior: Behavior normal.      UC Treatments / Results  Labs (all labs ordered are listed, but only abnormal results are displayed) Labs Reviewed  POCT RAPID STREP A (OFFICE) - Normal  CULTURE, GROUP A STREP ([facility])  POC SARS CORONAVIRUS 2 AG -  ED    EKG   Radiology No results found.  Procedures Procedures (including critical care time)  Medications Ordered in UC Medications - No data to display  Initial Impression / Assessment and Plan / UC Course  I have reviewed the triage vital signs and the nursing notes.  Pertinent labs & imaging results that were available during my care of the patient were reviewed by me and considered in my medical decision making (see chart for details).  The rapid strep test and COVID test were negative.  Throat culture is pending.  On exam, patient's lung sounds are clear throughout, she is well-appearing, and is in no acute distress.  Suspect a viral URI with cough.  Will provide symptomatic treatment with fluticasone  50 mcg nasal spray and Promethazine  DM for the cough.  Supportive care recommendations were provided and discussed with the patient to include fluids, rest, over-the-counter analgesics, warm salt water gargles, and Chloraseptic throat spray.  Discussed indications with patient regarding follow-up.  Patient was in agreement with this plan of care and verbalizes understanding.  All questions were answered.  Patient stable for discharge.  Work note was  provided.  Final Clinical Impressions(s) / UC Diagnoses   Final diagnoses:  Viral URI with cough  Sore throat     Discharge Instructions      The COVID and rapid strep test were negative.  A throat culture is pending.  You will be contacted if the pending test result is abnormal.  You also have access to your results via MyChart. Take medication as prescribed. Increase fluids and allow for plenty of rest. You may continue over-the-counter ibuprofen  or Tylenol as needed for pain, fever, or general discomfort. May use normal saline nasal spray throughout the day for nasal congestion and runny nose. Recommend warm salt water gargles 3-4 times daily or Chloraseptic throat spray or throat lozenges for throat pain or discomfort. Recommend use of a humidifier in your bedroom at nighttime during sleep and sleeping elevated on pillows while cough symptoms persist. Symptoms should improve over the next 5 to 7 days.  If symptoms fail to improve, or appear to be worsening, you may follow-up in this clinic or with your primary care physician for further evaluation. Follow-up as needed.   ED Prescriptions     Medication Sig Dispense Auth. Provider   promethazine -dextromethorphan (PROMETHAZINE -DM) 6.25-15 MG/5ML syrup Take 5 mLs by mouth 4 (four) times daily as needed. 118 mL Leath-Warren, Belen Bowers, NP   fluticasone  (FLONASE ) 50 MCG/ACT nasal spray Place 2 sprays into both nostrils daily. 16 g Leath-Warren, Belen Bowers, NP      PDMP not reviewed this encounter.   Hardy Lia, NP 06/10/23 1038

## 2023-06-10 NOTE — Discharge Instructions (Signed)
 The COVID and rapid strep test were negative.  A throat culture is pending.  You will be contacted if the pending test result is abnormal.  You also have access to your results via MyChart. Take medication as prescribed. Increase fluids and allow for plenty of rest. You may continue over-the-counter ibuprofen  or Tylenol as needed for pain, fever, or general discomfort. May use normal saline nasal spray throughout the day for nasal congestion and runny nose. Recommend warm salt water gargles 3-4 times daily or Chloraseptic throat spray or throat lozenges for throat pain or discomfort. Recommend use of a humidifier in your bedroom at nighttime during sleep and sleeping elevated on pillows while cough symptoms persist. Symptoms should improve over the next 5 to 7 days.  If symptoms fail to improve, or appear to be worsening, you may follow-up in this clinic or with your primary care physician for further evaluation. Follow-up as needed.

## 2023-06-12 LAB — CULTURE, GROUP A STREP (THRC)

## 2023-06-13 ENCOUNTER — Ambulatory Visit (HOSPITAL_COMMUNITY): Payer: Self-pay

## 2023-07-01 ENCOUNTER — Ambulatory Visit: Payer: Self-pay

## 2023-08-25 ENCOUNTER — Ambulatory Visit: Payer: Self-pay
